# Patient Record
Sex: Female | Born: 1995
Health system: Southern US, Community
[De-identification: ages and names within clinical notes are randomized; demographics above are authoritative.]

## PROBLEM LIST (undated history)

## (undated) ENCOUNTER — Inpatient Hospital Stay: Admission: EM | Payer: Self-pay | Source: Home / Self Care

## (undated) DIAGNOSIS — R519 Headache, unspecified: Secondary | ICD-10-CM

## (undated) DIAGNOSIS — F909 Attention-deficit hyperactivity disorder, unspecified type: Secondary | ICD-10-CM

## (undated) DIAGNOSIS — M419 Scoliosis, unspecified: Secondary | ICD-10-CM

## (undated) DIAGNOSIS — R51 Headache: Secondary | ICD-10-CM

## (undated) HISTORY — DX: Headache, unspecified: R51.9

## (undated) HISTORY — DX: Headache: R51

## (undated) HISTORY — DX: Attention-deficit hyperactivity disorder, unspecified type: F90.9

## (undated) HISTORY — DX: Scoliosis, unspecified: M41.9

---
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
## 2017-04-17 ENCOUNTER — Ambulatory Visit: Payer: Medicaid Other | Admitting: Neurology

## 2017-05-10 ENCOUNTER — Encounter (HOSPITAL_COMMUNITY): Payer: Self-pay | Admitting: Emergency Medicine

## 2017-05-10 ENCOUNTER — Emergency Department (HOSPITAL_COMMUNITY): Payer: Medicaid Other

## 2017-05-10 DIAGNOSIS — R079 Chest pain, unspecified: Secondary | ICD-10-CM | POA: Diagnosis present

## 2017-05-10 DIAGNOSIS — R0789 Other chest pain: Secondary | ICD-10-CM | POA: Insufficient documentation

## 2017-05-10 LAB — CBC
HEMATOCRIT: 40.7 % (ref 36.0–46.0)
Hemoglobin: 13.4 g/dL (ref 12.0–15.0)
MCH: 28.6 pg (ref 26.0–34.0)
MCHC: 32.9 g/dL (ref 30.0–36.0)
MCV: 87 fL (ref 78.0–100.0)
Platelets: 296 10*3/uL (ref 150–400)
RBC: 4.68 MIL/uL (ref 3.87–5.11)
RDW: 12.7 % (ref 11.5–15.5)
WBC: 10.4 10*3/uL (ref 4.0–10.5)

## 2017-05-10 LAB — BASIC METABOLIC PANEL
Anion gap: 9 (ref 5–15)
BUN: 8 mg/dL (ref 6–20)
CHLORIDE: 103 mmol/L (ref 101–111)
CO2: 23 mmol/L (ref 22–32)
Calcium: 9.2 mg/dL (ref 8.9–10.3)
Creatinine, Ser: 0.62 mg/dL (ref 0.44–1.00)
GFR calc Af Amer: >60 mL/min (ref >60)
GFR calc non Af Amer: >60 mL/min (ref >60)
GLUCOSE: 88 mg/dL (ref 65–99)
POTASSIUM: 3.8 mmol/L (ref 3.5–5.1)
Sodium: 135 mmol/L (ref 135–145)

## 2017-05-10 LAB — I-STAT TROPONIN, ED: Troponin i, poc: 0 ng/mL (ref 0.00–0.08)

## 2017-05-10 NOTE — ED Triage Notes (Signed)
Pt presents to ED for central chest pain starting 2 hours ago.  Pt denies any other associated symptoms.  Denies recent cough or vomiting.  Temp of 100.1 at triage.

## 2017-05-11 ENCOUNTER — Emergency Department (HOSPITAL_COMMUNITY)
Admission: EM | Admit: 2017-05-11 | Discharge: 2017-05-11 | Disposition: A | Discharge status: Home / Self Care | Payer: Medicaid Other | Attending: Emergency Medicine | Admitting: Emergency Medicine

## 2017-05-11 DIAGNOSIS — R0789 Other chest pain: Secondary | ICD-10-CM

## 2017-05-11 MED ORDER — IBUPROFEN 800 MG PO TABS: 800.0000 mg | ORAL_TABLET | Freq: Three times a day (TID) | ORAL | 0 refills | Status: DC | PRN

## 2017-05-11 MED ORDER — ACETAMINOPHEN 500 MG PO TABS
1000.0000 mg | ORAL_TABLET | Freq: Once | ORAL | Status: AC
  Administered 2017-05-11: 1000 mg via ORAL
  Filled 2017-05-11: qty 2

## 2017-05-11 MED ORDER — IBUPROFEN 800 MG PO TABS
800.0000 mg | ORAL_TABLET | Freq: Once | ORAL | Status: AC
  Administered 2017-05-11: 800 mg via ORAL
  Filled 2017-05-11: qty 1

## 2017-05-11 NOTE — Discharge Instructions (Signed)
You may alternate Tylenol 1000 mg every 6 hours as needed for pain and Ibuprofen 800 mg every 8 hours as needed for pain.  Please take Ibuprofen with food. ° °

## 2017-05-11 NOTE — ED Provider Notes (Signed)
By signing my name below, I, Rosana Fret, attest that this documentation has been prepared under the direction and in the presence of Kaylamarie Swickard, Layla Maw, DO. Electronically Signed: Rosana Fret, ED Scribe. 05/11/17. 1:46 AM.  TIME SEEN: 1:40 AM  CHIEF COMPLAINT: CP  HPI: HPI Comments: Kathy Kane is a 21 y.o. female who presents to the Emergency Department complaining of sharp, central CP onset 3 hours ago. No modifying factors. Pt states she was seen in the ED previously for similar pain and at that time she was diagnosed with costochondritis. No treatments prior to arrival. No recent tick bite or recent camping, hiking. Pt takes birth control medication.  No hx of PE/DVT, recent long travel, surgery, fracture, or prolonged immobilization. No tobacco use. Pt denies cough, vomiting, diarrhea, sore throat, ear pain or any other complaints at this time. She was found to have a low-grade fever here in the emergency department. She states she was not aware of this at home.   ROS: See HPI Constitutional: no fever  Eyes: no drainage  ENT: no runny nose, no sore throat, no ear pain Cardiovascular:  + chest pain  Resp: no SOB, no cough GI: no vomiting, no diarrhea GU: no dysuria Integumentary: no rash  Allergy: no hives  Musculoskeletal: no leg swelling  Neurological: no slurred speech ROS otherwise negative  PAST MEDICAL HISTORY/PAST SURGICAL HISTORY:  Past Medical History:  Diagnosis Date  . Migraine     MEDICATIONS:  Prior to Admission medications   Medication Sig Start Date End Date Taking? Authorizing Provider  ASHLYNA 0.15-0.03 &0.01 MG tablet Take 1 tablet by mouth daily. 08/04/16   [provider]  methylphenidate 36 MG PO CR tablet Take 72 mg by mouth daily. 09/16/16   [provider]    ALLERGIES:  No Known Allergies  SOCIAL HISTORY:  Social History  Substance Use Topics  . Smoking status: Never Smoker  . Smokeless tobacco: Never Used  .  Alcohol use No    FAMILY HISTORY: Family History  Problem Relation Age of Onset  . Migraines Mother   . Multiple sclerosis Mother     EXAM: BP (!) 117/55 (BP Location: Left Arm)   Pulse (!) 120   Temp 100.1 F (37.8 C) (Oral)   Resp 16   LMP 05/01/2017   SpO2 100%  CONSTITUTIONAL: Alert and oriented and responds appropriately to questions. Well-appearing; well-nourished, smiling, nontoxic, well-hydrated HEAD: Normocephalic EYES: Conjunctivae clear, pupils appear equal, EOMI ENT: normal nose; moist mucous membranes NECK: Supple, no meningismus, no nuchal rigidity, no LAD  CARD: RRR; S1 and S2 appreciated; no murmurs, no clicks, no rubs, no gallops CHEST:  Chest wall is tender to palpation over the anterior chest wall which reproduces her pain.  No crepitus, ecchymosis, erythema, warmth, rash or other lesions present.   RESP: Normal chest excursion without splinting or tachypnea; breath sounds clear and equal bilaterally; no wheezes, no rhonchi, no rales, no hypoxia or respiratory distress, speaking full sentences ABD/GI: Normal bowel sounds; non-distended; soft, non-tender, no rebound, no guarding, no peritoneal signs, no hepatosplenomegaly BACK:  The back appears normal and is non-tender to palpation, there is no CVA tenderness EXT: Normal ROM in all joints; non-tender to palpation; no edema; normal capillary refill; no cyanosis, no calf tenderness or swelling    SKIN: Normal color for age and race; warm; no rash NEURO: Moves all extremities equally PSYCH: The patient's mood and manner are appropriate. Grooming and personal hygiene are appropriate.  MEDICAL  DECISION MAKING: Patient here with atypical chest pain.  I suspect that this is chest wall pain. Cardiac labs obtained in triage are unremarkable. I do not think this is a pulmonary embolus. She is not tachycardic now that her fever has resolved. She has no tachypnea or hypoxia.  I do not think this is a dissection. She has no  pneumonia on her chest x-ray. No other signs of infection at this time. No meningismus, signs of pharyngitis, abdominal exam is benign. No recent tick bite. Doubt myocarditis, pericarditis. I recommended alternating Tylenol and Motrin for pain. I feel she is safe to be discharged. She is comfortable with this plan.  At this time, I do not feel there is any life-threatening condition present. I have reviewed and discussed all results (EKG, imaging, lab, urine as appropriate) and exam findings with patient/family. I have reviewed nursing notes and appropriate previous records.  I feel the patient is safe to be discharged home without further emergent workup and can continue workup as an outpatient as needed. Discussed usual and customary return precautions. Patient/family verbalize understanding and are comfortable with this plan.  Outpatient follow-up has been provided if needed. All questions have been answered.    EKG Interpretation  Date/Time:  Thursday May 10 2017 23:12:17 EDT Ventricular Rate:  110 PR Interval:  122 QRS Duration: 68 QT Interval:  314 QTC Calculation: 424 R Axis:   93 Text Interpretation:  Sinus tachycardia Right atrial enlargement Rightward axis Pulmonary disease pattern Abnormal ECG No significant change since last tracing Confirmed by Rochele RaringWard, Debralee Braaksma [phone] on 05/11/2017 12:32:16 AM Also confirmed by Bellagrace Sylvan, Baxter HireKristen [phone]), editor Madalyn RobEverhart, Marilyn [phone]  on 05/11/2017 7:51:07 AM        I personally performed the services described in this documentation, which was scribed in my presence. The recorded information has been reviewed and is accurate.    Ervey Fallin, Layla MawKristen N, DO 05/11/17 [id number]

## 2017-06-08 ENCOUNTER — Ambulatory Visit: Payer: Medicaid Other | Admitting: Internal Medicine

## 2017-06-18 ENCOUNTER — Encounter: Payer: Self-pay | Admitting: Cardiology

## 2017-06-18 DIAGNOSIS — M545 Low back pain, unspecified: Secondary | ICD-10-CM | POA: Insufficient documentation

## 2017-06-18 DIAGNOSIS — R079 Chest pain, unspecified: Secondary | ICD-10-CM

## 2017-06-18 NOTE — Progress Notes (Signed)
Cardiology Office Note  Date: 06/19/2017   ID: Kathy Kane, DOB Aug 30, 1996, MRN [id number]  PCP: Pearson GrippeKim, James, MD  Consulting Cardiologist: Nona DellSamuel Hykeem Ojeda, MD   Chief Complaint  Patient presents with  . Chest Pain    History of Present Illness: Kathy Kane is a 21 y.o. female referred for cardiology consultation by Dr. Selena BattenKim for evaluation of chest pain. She is here with her mother today. She states that over the last few years she has been having intermittent episodes of sharp chest discomfort, no obvious precipitant, feels short of breath has to sit still when this happens. She does not endorse any definite sense of palpitations at these times, but has had elevated heart rate related to medications for ADHD. She does not report any syncope. With exertion she does not have any reproducible chest pain or shortness of breath.  Record review finds ER visit in June secondary to atypical chest pain. ECG, chest x-ray, lab work did not reveal any obvious acute abnormalities. Troponin I level was normal. She was noted to have sinus tachycardia initially and also a low-grade elevation in temperature at 100.1 degrees.  She does have scoliosis, does not report any particular movement that will bring on chest pain.  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  Past Medical History:  Diagnosis Date  . ADHD   . Headache   . Scoliosis     Past Surgical History:  Procedure Laterality Date  . MOUTH SURGERY  2017    Current Outpatient Prescriptions  Medication Sig Dispense Refill  . ASHLYNA 0.15-0.03 &0.01 MG tablet Take 1 tablet by mouth daily.  6  . ibuprofen (ADVIL,MOTRIN) 800 MG tablet Take 1 tablet (800 mg total) by mouth every 8 (eight) hours as needed for mild pain. 30 tablet 0  . methocarbamol (ROBAXIN) 500 MG tablet Take 500 mg by mouth 2 (two) times daily.    . methylphenidate 36 MG PO CR tablet Take 72 mg by mouth 2 (two) times  daily.   0   No current facility-administered medications for this visit.    Allergies:  Adderall xr [amphetamine-dextroamphet er]   Social History: The patient  reports that she has never smoked. She has never used smokeless tobacco. She reports that she does not drink alcohol or use drugs.   Family History: The patient's family history includes Migraines in her mother; Multiple sclerosis in her mother.   ROS:  Please see the history of present illness. Otherwise, complete review of systems is positive for none.  All other systems are reviewed and negative.   Physical Exam: VS:  BP (!) 146/86 (BP Location: Right Arm)   Pulse (!) 108   Ht 5' 4.5" (1.638 m)   Wt 132 lb (59.9 kg)   SpO2 98%   BMI 22.31 kg/m , BMI Body mass index is 22.31 kg/m.  Wt Readings from Last 3 Encounters:  06/19/17 132 lb (59.9 kg) (58 %, Z= 0.20)*  10/16/16 133 lb 9.6 oz (60.6 kg) (64 %, Z= 0.35)*  02/09/15 121 lb 12.8 oz (55.2 kg) (50 %, Z= -0.01)*   * Growth percentiles are based on CDC 2-20 Years data.    General: Normally nourished appearing young woman, no distress. HEENT: Conjunctiva and lids normal, oropharynx clear. Neck: Supple, no elevated JVP or carotid bruits, no thyromegaly. Lungs: Clear to auscultation, nonlabored breathing at rest. Cardiac: Regular rate and rhythm, no S3 or significant systolic  murmur, no pericardial rub. Abdomen: Soft, nontender, bowel sounds present. Extremities: No pitting edema, distal pulses 2+. Skin: Warm and dry. Musculoskeletal: No kyphosis. Neuropsychiatric: Alert and oriented x3, affect grossly appropriate.  ECG: I personally reviewed the tracing from 05/10/2017 which showed sinus tachycardia.  Recent Labwork: 05/10/2017: BUN 8; Creatinine, Ser 0.62; Hemoglobin 13.4; Platelets 296; Potassium 3.8; Sodium 135  October 2017: Hemoglobin 13.5, platelets 270, BUN 14, creatinine 0.71, potassium 4.6, AST 18, ALT 29, TSH 2.18  Other Studies Reviewed Today:  Chest  x-ray 05/10/2017: FINDINGS: The lungs are well-aerated and clear. There is no evidence of focal opacification, pleural effusion or pneumothorax.  The heart is normal in size; the mediastinal contour is within normal limits. No acute osseous abnormalities are seen.  IMPRESSION: No acute cardiopulmonary process seen.  Assessment and Plan:  1. History of intermittent atypical chest pain as outlined above. No associated palpitations at the time of symptoms, no history of syncope. No clear exertional component. I reviewed her recent lab work, ECG, and chest x-ray. Symptoms are very unlikely to be related to ischemic heart disease, particularly at age 21. Stress testing is not indicated at this time. We did discuss obtaining an echocardiogram to ensure that she has a structurally normal heart, exclude mitral valve prolapse which would increase her chances of having intermittent chest pain or even arrhythmias. If echocardiogram is reassuring, no further cardiac testing is anticipated.  2. Scoliosis. Patient states that [facility] is [facility].  3. ADHD, currently on methylphenidate.  Current medicines were reviewed with the patient today.   Orders Placed This Encounter  Procedures  . EKG 12-Lead  . ECHOCARDIOGRAM COMPLETE    Disposition: Call with test results.  Signed, Jonelle SidleSamuel G. Carisma Troupe, MD, [facility] 06/19/2017 9:43 AM    [facility] [address], [place], [place] [address] Phone: [phone]; Fax: [phone]

## 2017-06-19 ENCOUNTER — Encounter: Payer: Self-pay | Admitting: Cardiology

## 2017-06-19 ENCOUNTER — Ambulatory Visit (INDEPENDENT_AMBULATORY_CARE_PROVIDER_SITE_OTHER): Payer: Medicaid Other | Admitting: Cardiology

## 2017-06-19 VITALS — BP 146/86 | HR 108 | Ht 64.5 in | Wt 132.0 lb

## 2017-06-19 DIAGNOSIS — M419 Scoliosis, unspecified: Secondary | ICD-10-CM

## 2017-06-19 DIAGNOSIS — F909 Attention-deficit hyperactivity disorder, unspecified type: Secondary | ICD-10-CM | POA: Diagnosis not present

## 2017-06-19 DIAGNOSIS — R002 Palpitations: Secondary | ICD-10-CM

## 2017-06-19 DIAGNOSIS — R0789 Other chest pain: Secondary | ICD-10-CM

## 2017-06-19 NOTE — Patient Instructions (Signed)
Your physician recommends that you schedule a follow-up appointment in: to be determined,we will call you with results.    Your physician has requested that you have an echocardiogram. Echocardiography is a painless test that uses sound waves to create images of your heart. It provides your doctor with information about the size and shape of your heart and how well your heart's chambers and valves are working. This procedure takes approximately one hour. There are no restrictions for this procedure.    No labs this visit      Thank you for choosing [facility] !

## 2017-06-21 ENCOUNTER — Ambulatory Visit (HOSPITAL_COMMUNITY)
Admission: RE | Admit: 2017-06-21 | Discharge: 2017-06-21 | Disposition: A | Discharge status: Home / Self Care | Payer: Medicaid Other | Source: Ambulatory Visit | Attending: Cardiology | Admitting: Cardiology

## 2017-06-21 DIAGNOSIS — M419 Scoliosis, unspecified: Secondary | ICD-10-CM | POA: Diagnosis not present

## 2017-06-21 DIAGNOSIS — R002 Palpitations: Secondary | ICD-10-CM | POA: Diagnosis not present

## 2017-06-21 DIAGNOSIS — R0789 Other chest pain: Secondary | ICD-10-CM

## 2017-06-21 DIAGNOSIS — I340 Nonrheumatic mitral (valve) insufficiency: Secondary | ICD-10-CM | POA: Insufficient documentation

## 2017-06-21 DIAGNOSIS — F909 Attention-deficit hyperactivity disorder, unspecified type: Secondary | ICD-10-CM | POA: Insufficient documentation

## 2017-06-21 NOTE — Progress Notes (Signed)
*  PRELIMINARY RESULTS* Echocardiogram 2D Echocardiogram has been performed.  Kathy Kane, Kathy Kane 06/21/2017, 9:10 AM

## 2017-11-28 ENCOUNTER — Encounter (HOSPITAL_COMMUNITY): Payer: Self-pay | Admitting: *Deleted

## 2017-11-28 ENCOUNTER — Emergency Department (HOSPITAL_COMMUNITY)
Admission: EM | Admit: 2017-11-28 | Discharge: 2017-11-28 | Disposition: A | Discharge status: Home / Self Care | Payer: Medicaid Other | Attending: Emergency Medicine | Admitting: Emergency Medicine

## 2017-11-28 DIAGNOSIS — J3489 Other specified disorders of nose and nasal sinuses: Secondary | ICD-10-CM | POA: Insufficient documentation

## 2017-11-28 DIAGNOSIS — F902 Attention-deficit hyperactivity disorder, combined type: Secondary | ICD-10-CM | POA: Insufficient documentation

## 2017-11-28 DIAGNOSIS — H6982 Other specified disorders of Eustachian tube, left ear: Secondary | ICD-10-CM | POA: Insufficient documentation

## 2017-11-28 DIAGNOSIS — R0981 Nasal congestion: Secondary | ICD-10-CM | POA: Diagnosis not present

## 2017-11-28 DIAGNOSIS — Z79899 Other long term (current) drug therapy: Secondary | ICD-10-CM | POA: Diagnosis not present

## 2017-11-28 DIAGNOSIS — H9202 Otalgia, left ear: Secondary | ICD-10-CM | POA: Diagnosis present

## 2017-11-28 MED ORDER — PSEUDOEPHEDRINE HCL 60 MG PO TABS: 60.0000 mg | ORAL_TABLET | Freq: Four times a day (QID) | ORAL | 0 refills | Status: DC | PRN

## 2017-11-28 MED ORDER — FLUTICASONE PROPIONATE 50 MCG/ACT NA SUSP: 1.0000 | Freq: Every day | NASAL | 0 refills | Status: DC

## 2017-11-28 NOTE — ED Triage Notes (Signed)
Pt reports she has had fluid in her left ear. She says that she has had discomfort in her ear since Thanksgiving. Pt denies fevers and has not had any medications PTA.

## 2017-11-29 NOTE — ED Provider Notes (Signed)
[facility] EMERGENCY DEPARTMENT Provider Note   CSN: [id number] Arrival date & time: 11/28/17  2223     History   Chief Complaint Chief Complaint  Patient presents with  . Otalgia    HPI Kathy Kane is a 22 y.o. female.  The history is provided by the patient.  Otalgia  This is a chronic problem. Episode onset: approximately 6 weeks ago, first noticed when driving at high elevations coming home from [place] Thanksgiving weekend. There is pain in the left ear. The problem occurs constantly. The problem has not changed since onset.There has been no fever. Associated symptoms include hearing loss. Pertinent negatives include no ear discharge, no rhinorrhea, no sore throat, no abdominal pain and no cough. Associated symptoms comments: Decreased hearing acuity in left ear.  She describes the need to "pop" that ear which she feels would resolve the problem but she is unable to make this happen with chewing gum and yawning.. Her past medical history does not include chronic ear infection.    Past Medical History:  Diagnosis Date  . ADHD   . Headache   . Scoliosis     Patient Active Problem List   Diagnosis Date Noted  . Chest pain, unspecified 06/18/2017  . Low back pain 06/18/2017  . Attention deficit hyperactivity disorder (ADHD) 10/17/2016    Past Surgical History:  Procedure Laterality Date  . MOUTH SURGERY  2017    OB History    No data available       Home Medications    Prior to Admission medications   Medication Sig Start Date End Date Taking? Authorizing Provider  ASHLYNA 0.15-0.03 &0.01 MG tablet Take 1 tablet by mouth daily. 08/04/16   [provider]  fluticasone (FLONASE) 50 MCG/ACT nasal spray Place 1 spray into both nostrils daily. 11/28/17   Kyria Bumgardner, Raynelle Fanning, PA-C  ibuprofen (ADVIL,MOTRIN) 800 MG tablet Take 1 tablet (800 mg total) by mouth every 8 (eight) hours as needed for mild pain. 05/11/17   Ward, Layla Maw, DO  methocarbamol (ROBAXIN)  500 MG tablet Take 500 mg by mouth 2 (two) times daily.    [provider]  methylphenidate 36 MG PO CR tablet Take 72 mg by mouth 2 (two) times daily.  09/16/16   [provider]  pseudoephedrine (SUDAFED) 60 MG tablet Take 1 tablet (60 mg total) by mouth every 6 (six) hours as needed for congestion. 11/28/17   Burgess Amor, PA-C    Family History Family History  Problem Relation Age of Onset  . Migraines Mother   . Multiple sclerosis Mother     Social History Social History   Tobacco Use  . Smoking status: Never Smoker  . Smokeless tobacco: Never Used  Substance Use Topics  . Alcohol use: No  . Drug use: No     Allergies   Adderall xr [amphetamine-dextroamphet er]   Review of Systems Review of Systems  Constitutional: Negative for chills and fever.  HENT: Positive for congestion, ear pain and hearing loss. Negative for ear discharge, rhinorrhea, sinus pressure, sore throat, trouble swallowing and voice change.   Eyes: Negative for discharge.  Respiratory: Negative for cough, shortness of breath, wheezing and stridor.   Cardiovascular: Negative for chest pain.  Gastrointestinal: Negative for abdominal pain.  Genitourinary: Negative.      Physical Exam Updated Vital Signs BP 117/64 (BP Location: Right Arm)   Pulse 84   Temp 98.3 F (36.8 C) (Oral)   Resp 17  Ht 5\' 4"  (1.626 m)   Wt 58.1 kg (128 lb)   LMP 10/30/2017   SpO2 100%   BMI 21.97 kg/m   Physical Exam  Constitutional: She is oriented to person, place, and time. She appears well-developed and well-nourished.  HENT:  Head: Normocephalic and atraumatic.  Right Ear: Tympanic membrane and ear canal normal.  Left Ear: Ear canal normal. No mastoid tenderness. Tympanic membrane is retracted. A middle ear effusion is present.  Nose: Mucosal edema and rhinorrhea present.  Mouth/Throat: Uvula is midline, oropharynx is clear and moist and mucous membranes are normal. No oropharyngeal exudate,  posterior oropharyngeal edema, posterior oropharyngeal erythema or tonsillar abscesses.  Eyes: Conjunctivae are normal.  Cardiovascular: Normal rate and normal heart sounds.  Pulmonary/Chest: Effort normal. No respiratory distress. She has no wheezes. She has no rales.  Musculoskeletal: Normal range of motion.  Neurological: She is alert and oriented to person, place, and time.  Skin: Skin is warm and dry. No rash noted.  Psychiatric: She has a normal mood and affect.     ED Treatments / Results  Labs (all labs ordered are listed, but only abnormal results are displayed) Labs Reviewed - No data to display  EKG  EKG Interpretation None       Radiology No results found.  Procedures Procedures (including critical care time)  Medications Ordered in ED Medications - No data to display   Initial Impression / Assessment and Plan / ED Course  I have reviewed the triage vital signs and the nursing notes.  Pertinent labs & imaging results that were available during my care of the patient were reviewed by me and considered in my medical decision making (see chart for details).     The patient appears reasonably screened and/or stabilized for discharge and I doubt any other medical condition or other [facility] requiring further screening, evaluation, or treatment in the ED at this time prior to discharge.  Pt with sx and exam suggesting eustachian dysfunction, no sign of otitis media or externa. No mastoid tenderness.  Flonase, sudafed, referral to ent for further eval if sx persist despite tx.  Final Clinical Impressions(s) / ED Diagnoses   Final diagnoses:  Eustachian tube dysfunction, left    ED Discharge Orders        Ordered    pseudoephedrine (SUDAFED) 60 MG tablet  Every 6 hours PRN     11/28/17 2310    fluticasone (FLONASE) 50 MCG/ACT nasal spray  Daily     11/28/17 2310       Burgess Amordol, Aaden Buckman, PA-C 11/29/17 [phone]    Bethann BerkshireZammit, Joseph, MD 11/29/17 1555

## 2017-12-03 IMAGING — CR DG CHEST 2V
2 series · 2 of 2 positions shown · non-contrast
Comparison: Chest radiograph performed 02/09/2015

CLINICAL DATA: Acute onset of generalized chest pain. Initial
encounter.

EXAM:
CHEST  2 VIEW

[chest pa]
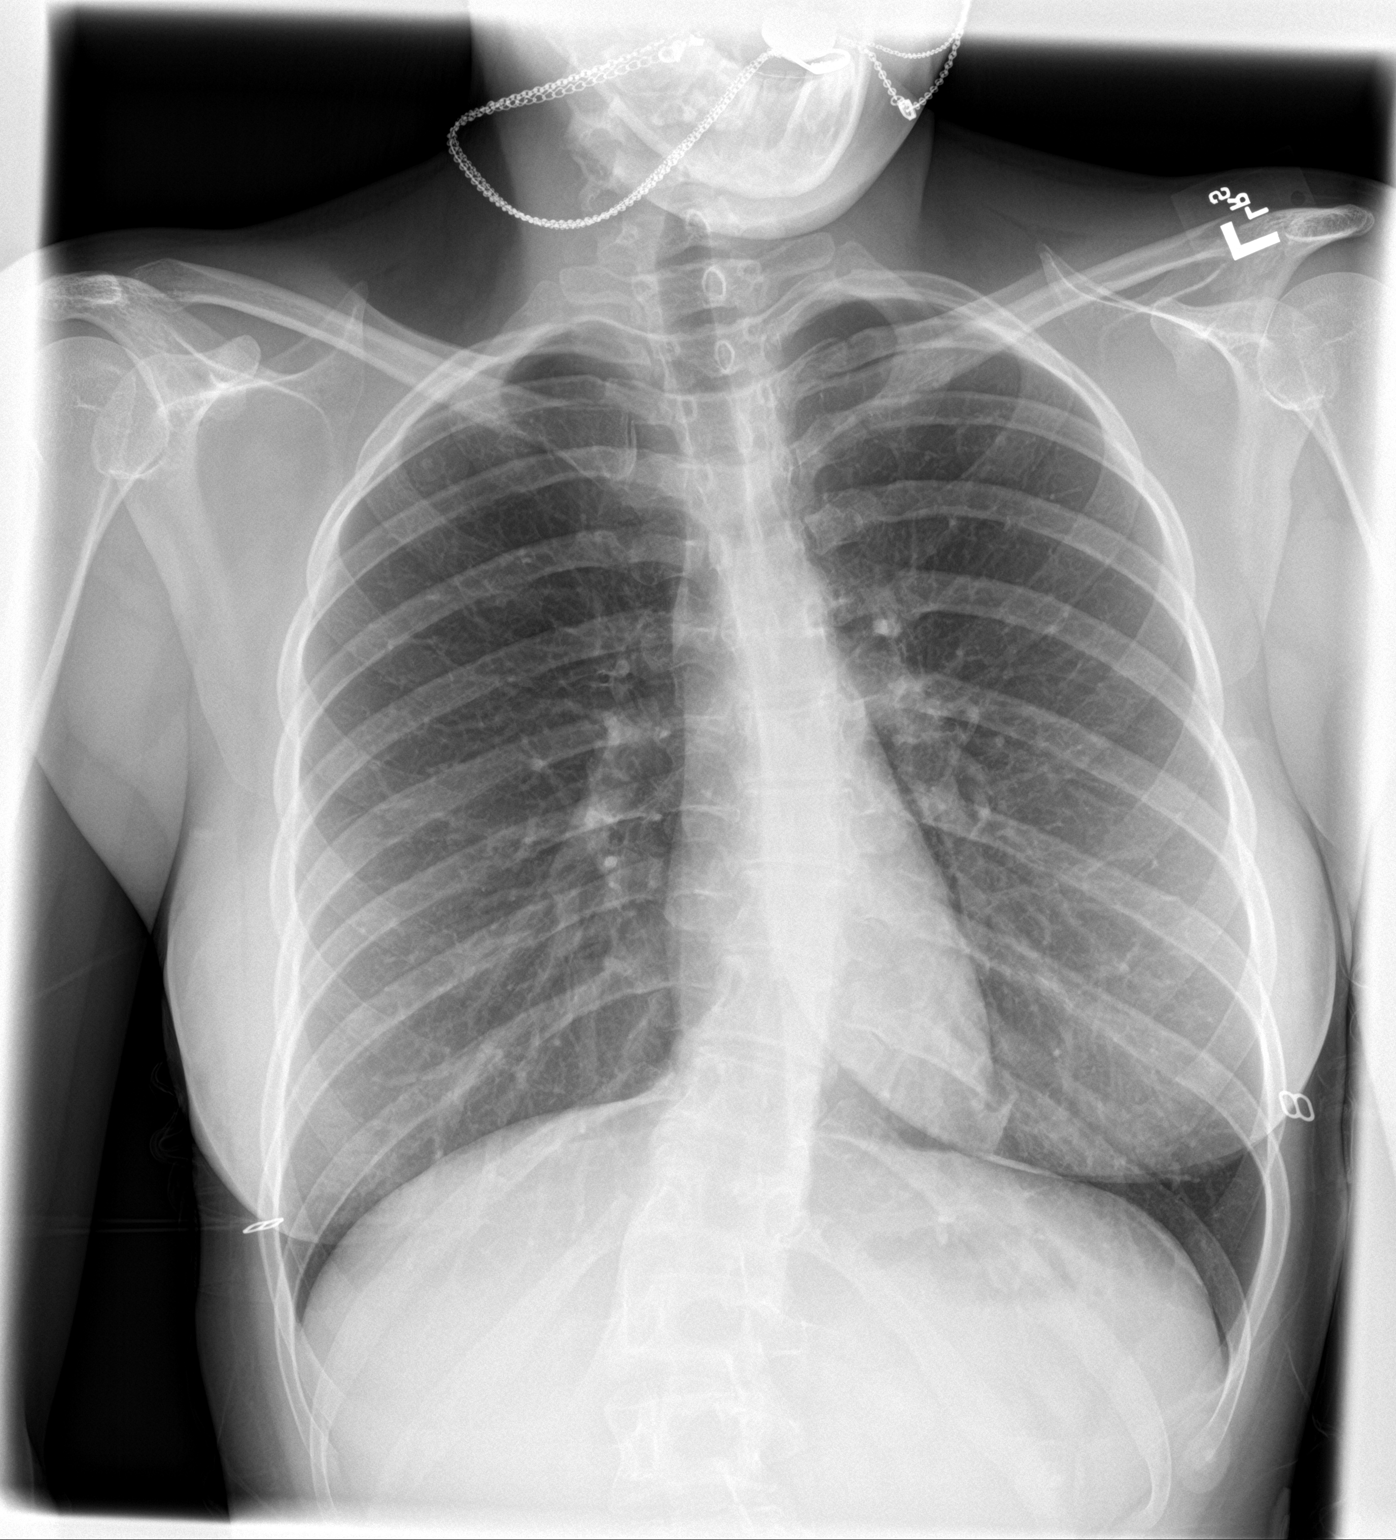

[chest lat]
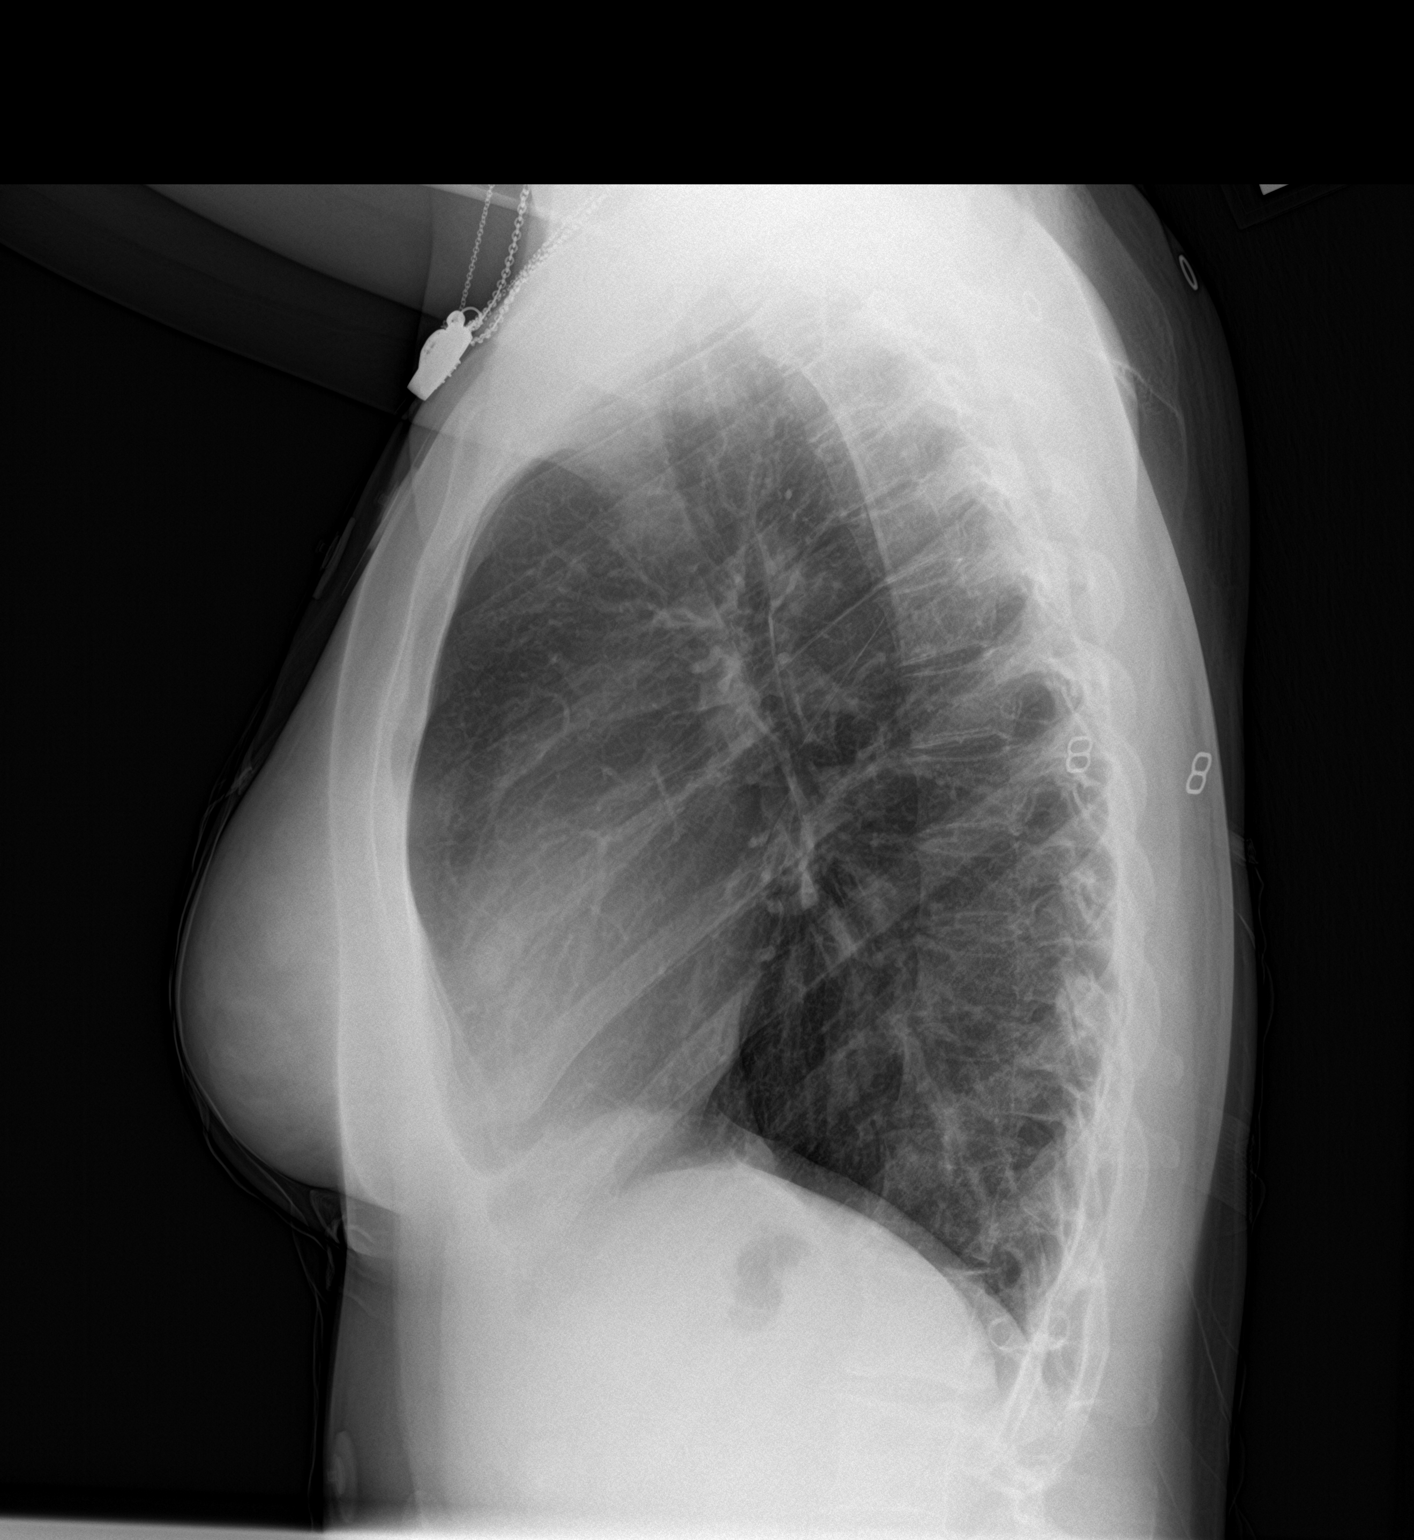

[2 of 2 positions shown; findings below may reference images not displayed]

FINDINGS: The lungs are well-aerated and clear. There is no evidence of focal
opacification, pleural effusion or pneumothorax.

The heart is normal in size; the mediastinal contour is within
normal limits. No acute osseous abnormalities are seen.
IMPRESSION: No acute cardiopulmonary process seen.

## 2018-01-22 DIAGNOSIS — F900 Attention-deficit hyperactivity disorder, predominantly inattentive type: Secondary | ICD-10-CM | POA: Diagnosis not present

## 2018-01-22 DIAGNOSIS — M419 Scoliosis, unspecified: Secondary | ICD-10-CM | POA: Diagnosis not present

## 2018-01-22 DIAGNOSIS — Z79899 Other long term (current) drug therapy: Secondary | ICD-10-CM | POA: Diagnosis not present

## 2018-03-07 ENCOUNTER — Encounter (HOSPITAL_BASED_OUTPATIENT_CLINIC_OR_DEPARTMENT_OTHER): Payer: Self-pay | Admitting: Emergency Medicine

## 2018-03-07 ENCOUNTER — Emergency Department (HOSPITAL_BASED_OUTPATIENT_CLINIC_OR_DEPARTMENT_OTHER)
Admission: EM | Admit: 2018-03-07 | Discharge: 2018-03-07 | Disposition: A | Discharge status: Home / Self Care | Payer: 59 | Attending: Emergency Medicine | Admitting: Emergency Medicine

## 2018-03-07 ENCOUNTER — Other Ambulatory Visit: Payer: Self-pay

## 2018-03-07 DIAGNOSIS — Z79899 Other long term (current) drug therapy: Secondary | ICD-10-CM | POA: Insufficient documentation

## 2018-03-07 DIAGNOSIS — B354 Tinea corporis: Secondary | ICD-10-CM | POA: Diagnosis not present

## 2018-03-07 DIAGNOSIS — R21 Rash and other nonspecific skin eruption: Secondary | ICD-10-CM | POA: Diagnosis present

## 2018-03-07 MED ORDER — CLOTRIMAZOLE-BETAMETHASONE 1-0.05 % EX CREA: TOPICAL_CREAM | CUTANEOUS | 1 refills | Status: DC

## 2018-03-07 NOTE — ED Triage Notes (Signed)
Has had a circular area on left lower leg x 3-4 months and thought she should get it checked out. About size of 50 cent piece , skin discolored

## 2018-03-07 NOTE — ED Provider Notes (Signed)
[facility] EMERGENCY DEPARTMENT Provider Note   CSN: [id number] Arrival date & time: 03/07/18  0056     History   Chief Complaint Chief Complaint  Patient presents with  . Abscess    HPI Kathy Kane is a 22 y.o. female.  Patient is a 22 year old female presenting with complaints of rash to the left lower leg.  This rash has been present for the past 3-4 months and began in the absence of any injury or trauma.  She denies significant itching.  She denies any pain or drainage.  The history is provided by the patient.    Past Medical History:  Diagnosis Date  . ADHD   . Headache   . Scoliosis     Patient Active Problem List   Diagnosis Date Noted  . Chest pain, unspecified 06/18/2017  . Low back pain 06/18/2017  . Attention deficit hyperactivity disorder (ADHD) 10/17/2016    Past Surgical History:  Procedure Laterality Date  . MOUTH SURGERY  2017     OB History   None      Home Medications    Prior to Admission medications   Medication Sig Start Date End Date Taking? Authorizing Provider  methylphenidate 36 MG PO CR tablet Take 72 mg by mouth 2 (two) times daily.  09/16/16  Yes [provider]  ASHLYNA 0.15-0.03 &0.01 MG tablet Take 1 tablet by mouth daily. 08/04/16   [provider]  fluticasone (FLONASE) 50 MCG/ACT nasal spray Place 1 spray into both nostrils daily. 11/28/17   Idol, Raynelle FanningJulie, PA-C  ibuprofen (ADVIL,MOTRIN) 800 MG tablet Take 1 tablet (800 mg total) by mouth every 8 (eight) hours as needed for mild pain. 05/11/17   Ward, Kathy MawKristen N, DO  methocarbamol (ROBAXIN) 500 MG tablet Take 500 mg by mouth 2 (two) times daily.    [provider]  pseudoephedrine (SUDAFED) 60 MG tablet Take 1 tablet (60 mg total) by mouth every 6 (six) hours as needed for congestion. 11/28/17   Burgess AmorIdol, Julie, PA-C    Family History Family History  Problem Relation Age of Onset  . Migraines Mother   . Multiple sclerosis Mother      Social History Social History   Tobacco Use  . Smoking status: Never Smoker  . Smokeless tobacco: Never Used  Substance Use Topics  . Alcohol use: No  . Drug use: No     Allergies   Adderall xr [amphetamine-dextroamphet er]   Review of Systems Review of Systems  All other systems reviewed and are negative.    Physical Exam Updated Vital Signs BP 134/79 (BP Location: Right Arm)   Pulse 90   Temp 98.2 F (36.8 C) (Oral)   Resp 16   Ht 5\' 4"  (1.626 m)   Wt 59.9 kg (132 lb)   SpO2 98%   BMI 22.66 kg/m   Physical Exam  Constitutional: She is oriented to person, place, and time. She appears well-developed and well-nourished. No distress.  HENT:  Head: Normocephalic and atraumatic.  Neck: Normal range of motion. Neck supple.  Neurological: She is alert and oriented to person, place, and time.  Skin: Skin is warm and dry. She is not diaphoretic.  There is a 2 cm, round area to the anterior aspect of the mid left lower leg.    Nursing note and vitals reviewed.    ED Treatments / Results  Labs (all labs ordered are listed, but only abnormal results are displayed) Labs Reviewed -  No data to display  EKG None  Radiology No results found.  Procedures Procedures (including critical care time)  Medications Ordered in ED Medications - No data to display   Initial Impression / Assessment and Plan / ED Course  I have reviewed the triage vital signs and the nursing notes.  Pertinent labs & imaging results that were available during my care of the patient were reviewed by me and considered in my medical decision making (see chart for details).  Appears to be tinea corporis.  Final Clinical Impressions(s) / ED Diagnoses   Final diagnoses:  None    ED Discharge Orders    None       Geoffery Lyons, MD 03/07/18 [phone]

## 2018-03-07 NOTE — Discharge Instructions (Addendum)
Lotrisone cream twice daily as prescribed.  Follow-up with your primary doctor if symptoms are not improving in the next 1-2 weeks.

## 2018-03-14 DIAGNOSIS — F909 Attention-deficit hyperactivity disorder, unspecified type: Secondary | ICD-10-CM | POA: Diagnosis not present

## 2018-03-14 DIAGNOSIS — Z79899 Other long term (current) drug therapy: Secondary | ICD-10-CM | POA: Diagnosis not present

## 2018-03-14 DIAGNOSIS — L02416 Cutaneous abscess of left lower limb: Secondary | ICD-10-CM | POA: Diagnosis not present

## 2018-05-01 DIAGNOSIS — M4125 Other idiopathic scoliosis, thoracolumbar region: Secondary | ICD-10-CM | POA: Diagnosis not present

## 2018-05-01 DIAGNOSIS — M545 Low back pain: Secondary | ICD-10-CM | POA: Diagnosis not present

## 2018-05-01 DIAGNOSIS — M546 Pain in thoracic spine: Secondary | ICD-10-CM | POA: Diagnosis not present

## 2018-06-11 DIAGNOSIS — M419 Scoliosis, unspecified: Secondary | ICD-10-CM | POA: Diagnosis not present

## 2018-06-11 DIAGNOSIS — F900 Attention-deficit hyperactivity disorder, predominantly inattentive type: Secondary | ICD-10-CM | POA: Diagnosis not present

## 2018-06-11 DIAGNOSIS — Z79899 Other long term (current) drug therapy: Secondary | ICD-10-CM | POA: Diagnosis not present

## 2018-07-31 MED FILL — CONCERTA 36 MG TABLET ER: 36 | 30 days supply | Qty: 60 | Fill #0

## 2018-08-30 MED FILL — CONCERTA 36 MG TABLET ER: 36 | 30 days supply | Qty: 60 | Fill #0

## 2018-09-11 DIAGNOSIS — Z308 Encounter for other contraceptive management: Secondary | ICD-10-CM | POA: Diagnosis not present

## 2018-09-11 DIAGNOSIS — M549 Dorsalgia, unspecified: Secondary | ICD-10-CM | POA: Diagnosis not present

## 2018-09-11 DIAGNOSIS — Z793 Long term (current) use of hormonal contraceptives: Secondary | ICD-10-CM | POA: Diagnosis not present

## 2018-09-11 DIAGNOSIS — Z79899 Other long term (current) drug therapy: Secondary | ICD-10-CM | POA: Diagnosis not present

## 2018-09-11 DIAGNOSIS — F909 Attention-deficit hyperactivity disorder, unspecified type: Secondary | ICD-10-CM | POA: Diagnosis not present

## 2018-09-27 DIAGNOSIS — M4125 Other idiopathic scoliosis, thoracolumbar region: Secondary | ICD-10-CM | POA: Diagnosis not present

## 2018-10-03 MED FILL — CONCERTA 36 MG TABLET ER: 36 | 30 days supply | Qty: 60 | Fill #0

## 2018-11-08 MED FILL — CONCERTA 36 MG TABLET ER: 36 | 30 days supply | Qty: 60 | Fill #0

## 2018-12-12 DIAGNOSIS — Z79899 Other long term (current) drug therapy: Secondary | ICD-10-CM | POA: Diagnosis not present

## 2018-12-12 DIAGNOSIS — F909 Attention-deficit hyperactivity disorder, unspecified type: Secondary | ICD-10-CM | POA: Diagnosis not present

## 2018-12-12 DIAGNOSIS — M549 Dorsalgia, unspecified: Secondary | ICD-10-CM | POA: Diagnosis not present

## 2018-12-12 DIAGNOSIS — M419 Scoliosis, unspecified: Secondary | ICD-10-CM | POA: Diagnosis not present

## 2018-12-31 DIAGNOSIS — N3090 Cystitis, unspecified without hematuria: Secondary | ICD-10-CM | POA: Diagnosis not present

## 2018-12-31 DIAGNOSIS — K5900 Constipation, unspecified: Secondary | ICD-10-CM | POA: Diagnosis not present

## 2018-12-31 DIAGNOSIS — R109 Unspecified abdominal pain: Secondary | ICD-10-CM | POA: Diagnosis not present

## 2019-02-14 ENCOUNTER — Ambulatory Visit (INDEPENDENT_AMBULATORY_CARE_PROVIDER_SITE_OTHER): Payer: 59

## 2019-02-14 ENCOUNTER — Ambulatory Visit (HOSPITAL_COMMUNITY)
Admission: EM | Admit: 2019-02-14 | Discharge: 2019-02-14 | Disposition: A | Discharge status: Home / Self Care | Payer: 59 | Attending: Family Medicine | Admitting: Family Medicine

## 2019-02-14 ENCOUNTER — Other Ambulatory Visit: Payer: Self-pay

## 2019-02-14 DIAGNOSIS — R2232 Localized swelling, mass and lump, left upper limb: Secondary | ICD-10-CM

## 2019-02-14 DIAGNOSIS — S61221A Laceration with foreign body of left index finger without damage to nail, initial encounter: Secondary | ICD-10-CM

## 2019-02-14 DIAGNOSIS — W2210XA Striking against or struck by unspecified automobile airbag, initial encounter: Secondary | ICD-10-CM

## 2019-02-14 DIAGNOSIS — T24112A Burn of first degree of left thigh, initial encounter: Secondary | ICD-10-CM | POA: Diagnosis not present

## 2019-02-14 DIAGNOSIS — T24109A Burn of first degree of unspecified site of unspecified lower limb, except ankle and foot, initial encounter: Secondary | ICD-10-CM | POA: Diagnosis not present

## 2019-02-14 DIAGNOSIS — W2211XA Striking against or struck by driver side automobile airbag, initial encounter: Secondary | ICD-10-CM | POA: Diagnosis not present

## 2019-02-14 DIAGNOSIS — T2210XA Burn of first degree of shoulder and upper limb, except wrist and hand, unspecified site, initial encounter: Secondary | ICD-10-CM | POA: Diagnosis not present

## 2019-02-14 DIAGNOSIS — S6992XA Unspecified injury of left wrist, hand and finger(s), initial encounter: Secondary | ICD-10-CM | POA: Diagnosis not present

## 2019-02-14 DIAGNOSIS — M79645 Pain in left finger(s): Secondary | ICD-10-CM | POA: Diagnosis not present

## 2019-02-14 DIAGNOSIS — R600 Localized edema: Secondary | ICD-10-CM | POA: Diagnosis not present

## 2019-02-14 DIAGNOSIS — T148XXA Other injury of unspecified body region, initial encounter: Secondary | ICD-10-CM

## 2019-02-14 MED ORDER — SILVER SULFADIAZINE 1 % EX CREA: 1.0000 "application " | TOPICAL_CREAM | Freq: Two times a day (BID) | CUTANEOUS | 1 refills | Status: DC
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
## 2019-02-14 NOTE — Discharge Instructions (Addendum)
Contact [facility] office on Monday to schedule a follow-up appointment.
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
## 2019-02-17 DIAGNOSIS — S63631A Sprain of interphalangeal joint of left index finger, initial encounter: Secondary | ICD-10-CM | POA: Diagnosis not present

## 2019-02-27 DIAGNOSIS — S63631D Sprain of interphalangeal joint of left index finger, subsequent encounter: Secondary | ICD-10-CM | POA: Diagnosis not present

## 2019-03-13 DIAGNOSIS — S63631D Sprain of interphalangeal joint of left index finger, subsequent encounter: Secondary | ICD-10-CM | POA: Diagnosis not present

## 2019-03-19 DIAGNOSIS — Z308 Encounter for other contraceptive management: Secondary | ICD-10-CM | POA: Diagnosis not present

## 2019-03-19 DIAGNOSIS — M549 Dorsalgia, unspecified: Secondary | ICD-10-CM | POA: Diagnosis not present

## 2019-03-19 DIAGNOSIS — F909 Attention-deficit hyperactivity disorder, unspecified type: Secondary | ICD-10-CM | POA: Diagnosis not present

## 2019-03-19 DIAGNOSIS — M419 Scoliosis, unspecified: Secondary | ICD-10-CM | POA: Diagnosis not present

## 2019-03-19 DIAGNOSIS — Z79899 Other long term (current) drug therapy: Secondary | ICD-10-CM | POA: Diagnosis not present

## 2019-03-19 DIAGNOSIS — Z793 Long term (current) use of hormonal contraceptives: Secondary | ICD-10-CM | POA: Diagnosis not present

## 2019-05-29 DIAGNOSIS — Z6828 Body mass index (BMI) 28.0-28.9, adult: Secondary | ICD-10-CM | POA: Diagnosis not present

## 2019-05-29 DIAGNOSIS — Z113 Encounter for screening for infections with a predominantly sexual mode of transmission: Secondary | ICD-10-CM | POA: Diagnosis not present

## 2019-05-29 DIAGNOSIS — Z8041 Family history of malignant neoplasm of ovary: Secondary | ICD-10-CM | POA: Diagnosis not present

## 2019-05-29 DIAGNOSIS — Z01419 Encounter for gynecological examination (general) (routine) without abnormal findings: Secondary | ICD-10-CM | POA: Diagnosis not present

## 2019-06-19 DIAGNOSIS — Z809 Family history of malignant neoplasm, unspecified: Secondary | ICD-10-CM | POA: Diagnosis not present

## 2020-11-08 ENCOUNTER — Encounter: Payer: Self-pay | Admitting: Obstetrics and Gynecology

## 2021-09-06 NOTE — Telephone Encounter (Signed)
error 

## 2021-11-28 LAB — OB RESULTS CONSOLE ABO/RH: RH Type: POSITIVE

## 2021-11-28 LAB — OB RESULTS CONSOLE HIV ANTIBODY (ROUTINE TESTING): HIV: NONREACTIVE

## 2021-11-28 LAB — OB RESULTS CONSOLE RPR: RPR: NONREACTIVE

## 2021-11-28 LAB — OB RESULTS CONSOLE RUBELLA ANTIBODY, IGM: Rubella: IMMUNE

## 2021-11-28 LAB — OB RESULTS CONSOLE GC/CHLAMYDIA
Chlamydia: NEGATIVE
Neisseria Gonorrhea: NEGATIVE

## 2021-11-28 LAB — OB RESULTS CONSOLE HEPATITIS B SURFACE ANTIGEN: Hepatitis B Surface Ag: NEGATIVE

## 2021-12-02 ENCOUNTER — Encounter (HOSPITAL_BASED_OUTPATIENT_CLINIC_OR_DEPARTMENT_OTHER): Payer: BC Managed Care – PPO

## 2022-04-03 ENCOUNTER — Inpatient Hospital Stay (HOSPITAL_COMMUNITY)
Admission: AD | Admit: 2022-04-03 | Discharge: 2022-04-03 | Disposition: A | Discharge status: Home / Self Care | Payer: BC Managed Care – PPO | Attending: Obstetrics and Gynecology | Admitting: Obstetrics and Gynecology

## 2022-04-03 ENCOUNTER — Encounter (HOSPITAL_COMMUNITY): Payer: Self-pay

## 2022-04-03 DIAGNOSIS — O26892 Other specified pregnancy related conditions, second trimester: Secondary | ICD-10-CM | POA: Diagnosis not present

## 2022-04-03 DIAGNOSIS — Z3A25 25 weeks gestation of pregnancy: Secondary | ICD-10-CM | POA: Diagnosis not present

## 2022-04-03 DIAGNOSIS — R102 Pelvic and perineal pain: Secondary | ICD-10-CM | POA: Insufficient documentation

## 2022-04-03 DIAGNOSIS — N949 Unspecified condition associated with female genital organs and menstrual cycle: Secondary | ICD-10-CM | POA: Diagnosis not present

## 2022-04-03 LAB — URINALYSIS, ROUTINE W REFLEX MICROSCOPIC
Bilirubin Urine: NEGATIVE
Glucose, UA: NEGATIVE mg/dL
Hgb urine dipstick: NEGATIVE
Ketones, ur: NEGATIVE mg/dL
Nitrite: NEGATIVE
Protein, ur: NEGATIVE mg/dL
Specific Gravity, Urine: 1.006 (ref 1.005–1.030)
pH: 7 (ref 5.0–8.0)

## 2022-04-03 NOTE — MAU Provider Note (Signed)
?History  ?  ? ?CSN: [id number] ? ?Arrival date and time: 04/03/22 0743 ? ? Event Date/Time  ? First Provider Initiated Contact with Patient 04/03/22 0914   ?  ? ?Chief Complaint  ?Patient presents with  ? Abdominal Pain  ? ?HPI ?26 year old G1, P0 at 25 weeks and 5 days who presents with lower abdominal pain that started last night when she laid down.  Worse when up and moving around.  She was a bit more active on Saturday and walk around during a car show.  No other palliating or provoking factors.  Has not been using abdominal support band.  Also reports decreased fetal movement. ? ?OB History   ? ? Gravida  ?1  ? Para  ?   ? Term  ?   ? Preterm  ?   ? AB  ?   ? Living  ?   ?  ? ? SAB  ?   ? IAB  ?   ? Ectopic  ?   ? Multiple  ?   ? Live Births  ?   ?   ?  ?  ? ? ?Past Medical History:  ?Diagnosis Date  ? ADHD   ? Headache   ? Scoliosis   ? ? ?Past Surgical History:  ?Procedure Laterality Date  ? MOUTH SURGERY  2017  ? ? ?Family History  ?Problem Relation Age of Onset  ? Migraines Mother   ? Multiple sclerosis Mother   ? ? ?Social History  ? ?Tobacco Use  ? Smoking status: Never  ? Smokeless tobacco: Never  ?Vaping Use  ? Vaping Use: Never used  ?Substance Use Topics  ? Alcohol use: No  ? Drug use: No  ? ? ?Allergies:  ?Allergies  ?Allergen Reactions  ? Adderall Xr [Amphetamine-Dextroamphet Er]   ? Penicillins Hives  ? ? ?Medications Prior to Admission  ?Medication Sig Dispense Refill Last Dose  ? Prenatal Vit-Fe Fumarate-FA (PRENATAL MULTIVITAMIN) TABS tablet Take 1 tablet by mouth daily at 12 noon.   04/03/2022  ? ASHLYNA 0.15-0.03 &0.01 MG tablet Take 1 tablet by mouth daily.  6   ? clotrimazole-betamethasone (LOTRISONE) cream Apply to affected area 2 times daily 15 g 1   ? fluticasone (FLONASE) 50 MCG/ACT nasal spray Place 1 spray into both nostrils daily. 15.8 g 0   ? ibuprofen (ADVIL,MOTRIN) 800 MG tablet Take 1 tablet (800 mg total) by mouth every 8 (eight) hours as needed for mild pain. 30 tablet 0   ?  methocarbamol (ROBAXIN) 500 MG tablet Take 500 mg by mouth 2 (two) times daily.     ? methylphenidate 36 MG PO CR tablet Take 72 mg by mouth 2 (two) times daily.   0   ? pseudoephedrine (SUDAFED) 60 MG tablet Take 1 tablet (60 mg total) by mouth every 6 (six) hours as needed for congestion. 30 tablet 0   ? silver sulfADIAZINE (SILVADENE) 1 % cream Apply 1 application topically 2 (two) times daily. Clean site of wound and apply cream. 100 g 1   ? ? ?Review of Systems ?Physical Exam  ? ?Blood pressure 95/72, pulse 91, temperature 98.9 ?F (37.2 ?C), temperature source Oral, resp. rate 16, height 5' 4.5" (1.638 m), weight 81.2 kg, SpO2 99 %. ? ?Physical Exam ?Vitals reviewed.  ?Constitutional:   ?   Appearance: She is well-developed.  ?HENT:  ?   Head: Normocephalic and atraumatic.  ?Skin: ?   General: Skin is warm and dry.  ?  Capillary Refill: Capillary refill takes less than 2 seconds.  ?Neurological:  ?   General: No focal deficit present.  ?   Mental Status: She is alert and oriented to person, place, and time.  ?Psychiatric:     ?   Mood and Affect: Mood normal.     ?   Behavior: Behavior normal.  ? ? ?MAU Course  ?Procedures ?NST:  ?Baseline: 140s  ?Variability: moderate ?Accelerations: 10x10  ?Decelerations: none ?Contractions: none ? ? ?MDM ? ?Assessment and Plan  ? ?1. [redacted] weeks gestation of pregnancy   ?2. Round ligament pain   ? ?Discussed abdominal support band.  Approximately 10 fetal movements felt in the past 20 minutes.  Discharge to home with reassurance. ? ?Levie Heritage ?04/03/2022, 9:19 AM  ?

## 2022-04-03 NOTE — MAU Note (Signed)
.  Kathy Kane is a 26 y.o. at [redacted]w[redacted]d here in MAU reporting: lower sharp pain in her lower abdomen that started last night. She states that it is constant and is worse when she lays on her sides. Denies VB or LOF. DFM for the last x2 days. No recent intercourse.  ? ?Pain score: 8 ?Vitals:  ? 04/03/22 0758  ?BP: 112/64  ?Pulse: 85  ?Resp: 16  ?Temp: 98.9 ?F (37.2 ?C)  ?SpO2: 99%  ?   ?FHT:144 ?Lab orders placed from triage: UA  ?

## 2022-04-04 LAB — URINE CULTURE

## 2022-04-20 DIAGNOSIS — Z419 Encounter for procedure for purposes other than remedying health state, unspecified: Secondary | ICD-10-CM | POA: Diagnosis not present

## 2022-05-20 DIAGNOSIS — Z419 Encounter for procedure for purposes other than remedying health state, unspecified: Secondary | ICD-10-CM | POA: Diagnosis not present

## 2022-06-07 ENCOUNTER — Encounter (HOSPITAL_COMMUNITY)
Admission: RE | Admit: 2022-06-07 | Discharge: 2022-06-07 | Disposition: A | Discharge status: Home / Self Care | Payer: BC Managed Care – PPO | Source: Ambulatory Visit | Attending: Obstetrics and Gynecology | Admitting: Obstetrics and Gynecology

## 2022-06-07 DIAGNOSIS — O99012 Anemia complicating pregnancy, second trimester: Secondary | ICD-10-CM | POA: Diagnosis present

## 2022-06-07 MED ORDER — FERUMOXYTOL INJECTION 510 MG/17 ML
510.0000 mg | INTRAVENOUS | Status: DC
  Administered 2022-06-07: 510 mg via INTRAVENOUS
  Filled 2022-06-07: qty 510

## 2022-06-12 ENCOUNTER — Inpatient Hospital Stay (HOSPITAL_COMMUNITY): Admission: RE | Admit: 2022-06-12 | Payer: BC Managed Care – PPO | Source: Ambulatory Visit

## 2022-06-12 ENCOUNTER — Inpatient Hospital Stay (HOSPITAL_COMMUNITY)
Admission: AD | Admit: 2022-06-12 | Discharge: 2022-06-13 | Disposition: A | Discharge status: Home / Self Care | Payer: BC Managed Care – PPO | Attending: Obstetrics and Gynecology | Admitting: Obstetrics and Gynecology

## 2022-06-12 ENCOUNTER — Other Ambulatory Visit: Payer: Self-pay

## 2022-06-12 ENCOUNTER — Encounter (HOSPITAL_COMMUNITY): Payer: Self-pay | Admitting: Obstetrics and Gynecology

## 2022-06-12 DIAGNOSIS — O26893 Other specified pregnancy related conditions, third trimester: Secondary | ICD-10-CM | POA: Insufficient documentation

## 2022-06-12 DIAGNOSIS — Z3A35 35 weeks gestation of pregnancy: Secondary | ICD-10-CM | POA: Diagnosis not present

## 2022-06-12 DIAGNOSIS — M545 Low back pain, unspecified: Secondary | ICD-10-CM

## 2022-06-12 DIAGNOSIS — R102 Pelvic and perineal pain: Secondary | ICD-10-CM | POA: Diagnosis not present

## 2022-06-12 LAB — WET PREP, GENITAL
Clue Cells Wet Prep HPF POC: NONE SEEN
Sperm: NONE SEEN
Trich, Wet Prep: NONE SEEN
WBC, Wet Prep HPF POC: <10 (ref <10)
Yeast Wet Prep HPF POC: NONE SEEN

## 2022-06-12 LAB — URINALYSIS, ROUTINE W REFLEX MICROSCOPIC
Bilirubin Urine: NEGATIVE
Glucose, UA: NEGATIVE mg/dL
Hgb urine dipstick: NEGATIVE
Ketones, ur: NEGATIVE mg/dL
Nitrite: NEGATIVE
Protein, ur: NEGATIVE mg/dL
Specific Gravity, Urine: 1.014 (ref 1.005–1.030)
pH: 6 (ref 5.0–8.0)

## 2022-06-12 MED ORDER — ONDANSETRON 4 MG PO TBDP
8.0000 mg | ORAL_TABLET | Freq: Once | ORAL | Status: AC
  Administered 2022-06-12: 8 mg via ORAL

## 2022-06-12 MED ORDER — ONDANSETRON 4 MG PO TBDP
ORAL_TABLET | ORAL | Status: AC
  Filled 2022-06-12: qty 2

## 2022-06-12 NOTE — MAU Note (Signed)
Kathy Kane is a 26 y.o. at [redacted]w[redacted]d here in MAU reporting: pain from lower abdomen that has been constant.. started last night but today started to wrap around to her lower back. Reports baby doesn't move a lot usually but has not felt movement since earlier today. Denies VB or LOF. Called on call doctor and was told to come to MAU. Reports had iron infusion on Wednesday and states it made her feel bad and has had some chest pains "every now and then" since.. last felt it in the car on the way here, but none currently.   Pain score: 7 Vitals:   06/12/22 2139  BP: 122/74  Pulse: 100  Resp: 18  Temp: 98.3 F (36.8 C)  SpO2: 97%    FHT:142 Lab orders placed from triage: u/a

## 2022-06-12 NOTE — MAU Provider Note (Signed)
Chief Complaint:  Abdominal Pain   Event Date/Time   First Provider Initiated Contact with Patient 06/12/22 2239     HPI: Kathy Kane is a 26 y.o. G1P0 at [redacted]w[redacted]d who presents to maternity admissions reporting increased pelvic pressure and low back pain that became exacerbated yesterday. This pelvic pressure is not new, just worse than usual nad has eased up considerably since arriving to MAU. Has not taken anything for pain, does not want to take anything here. Did complain of some nausea and requested meds. Denies vaginal bleeding, leaking of fluid, decreased fetal movement, fever, falls, or recent illness.   Pregnancy Course: Receives care at [facility], states she was written out of work at [employer] for preterm labor and has been getting weekly cervical exams to assess for dilation but is always closed.   Past Medical History:  Diagnosis Date   ADHD    Headache    Scoliosis    OB History  Gravida Para Term Preterm AB Living  1            SAB IAB Ectopic Multiple Live Births               # Outcome Date GA Lbr Len/2nd Weight Sex Delivery Anes PTL Lv  1 Current            Past Surgical History:  Procedure Laterality Date   MOUTH SURGERY  2017   Family History  Problem Relation Age of Onset   Migraines Mother    Multiple sclerosis Mother    Social History   Tobacco Use   Smoking status: Never   Smokeless tobacco: Never  Vaping Use   Vaping Use: Never used  Substance Use Topics   Alcohol use: No   Drug use: No   Allergies  Allergen Reactions   Adderall Xr [Amphetamine-Dextroamphet Er]     Causes tachycardia   Penicillins Hives   No medications prior to admission.   I have reviewed patient's Past Medical Hx, Surgical Hx, Family Hx, Social Hx, medications and allergies.   ROS:  Pertinent items noted in HPI and remainder of comprehensive ROS otherwise negative.   Physical Exam  Patient Vitals for the past 24 hrs:  BP Temp Temp src Pulse Resp SpO2  Height Weight  06/12/22 2331 124/60 98.5 F (36.9 C) Oral 98 (!) 117 -- -- --  06/12/22 2139 122/74 98.3 F (36.8 C) Oral 100 18 97 % 5\' 4"  (1.626 m) 187 lb 8 oz (85 kg)   Constitutional: Well-developed, well-nourished female in no acute distress.  Cardiovascular: normal rate & rhythm Respiratory: normal effort GI: Abd soft, non-tender, gravid appropriate for gestational age MS: Extremities nontender, no edema, normal ROM Neurologic: Alert and oriented x 4.  GU: no CVA tenderness Pelvic: NEFG, physiologic discharge, no blood  Dilation: Closed Effacement (%): 80 Cervical Position: Posterior Station: -1 Presentation: Vertex Exam by:: Gaylan Gerold CNM   Fetal Tracing: reactive Baseline: 135 Variability: moderate Accelerations: 15x15 Decelerations: none Toco: relaxed, some mild UI   Labs: Results for orders placed or performed during the hospital encounter of 06/12/22 (from the past 24 hour(s))  Urinalysis, Routine w reflex microscopic Urine, Clean Catch     Status: Abnormal   Collection Time: 06/12/22  9:46 PM  Result Value Ref Range   Color, Urine YELLOW YELLOW   APPearance CLOUDY (A) CLEAR   Specific Gravity, Urine 1.014 1.005 - 1.030   pH 6.0 5.0 - 8.0   Glucose, UA NEGATIVE  NEGATIVE mg/dL   Hgb urine dipstick NEGATIVE NEGATIVE   Bilirubin Urine NEGATIVE NEGATIVE   Ketones, ur NEGATIVE NEGATIVE mg/dL   Protein, ur NEGATIVE NEGATIVE mg/dL   Nitrite NEGATIVE NEGATIVE   Leukocytes,Ua MODERATE (A) NEGATIVE   RBC / HPF 0-5 0 - 5 RBC/hpf   WBC, UA 11-20 0 - 5 WBC/hpf   Bacteria, UA FEW (A) NONE SEEN   Squamous Epithelial / LPF 21-50 0 - 5   Mucus PRESENT    Amorphous Crystal PRESENT   Fetal fibronectin     Status: None   Collection Time: 06/12/22 11:34 PM  Result Value Ref Range   Fetal Fibronectin NEGATIVE NEGATIVE  Wet prep, genital     Status: None   Collection Time: 06/12/22 11:34 PM   Specimen: Vaginal  Result Value Ref Range   Yeast Wet Prep HPF POC NONE  SEEN NONE SEEN   Trich, Wet Prep NONE SEEN NONE SEEN   Clue Cells Wet Prep HPF POC NONE SEEN NONE SEEN   WBC, Wet Prep HPF POC <10 <10   Sperm NONE SEEN   Group B strep by PCR     Status: None   Collection Time: 06/12/22 11:34 PM   Specimen: Vaginal; Genital  Result Value Ref Range   Group B strep by PCR NEGATIVE NEGATIVE   Imaging:  No results found.  MAU Course: Orders Placed This Encounter  Procedures   Wet prep, genital   Group B strep by PCR   Urinalysis, Routine w reflex microscopic Urine, Clean Catch   Fetal fibronectin   Discharge patient   Meds ordered this encounter  Medications   ondansetron (ZOFRAN-ODT) disintegrating tablet 8 mg   ondansetron (ZOFRAN-ODT) 4 MG disintegrating tablet    Daphine Deutscher, Dea F: cabinet override   cyclobenzaprine (FLEXERIL) 10 MG tablet    Sig: Take 1 tablet (10 mg total) by mouth every 8 (eight) hours as needed for muscle spasms.    Dispense:  30 tablet    Refill:  1    Order Specific Question:   Supervising Provider    Answer:   Samara Snide   MDM: Zofran given with good relief of nausea.  Collected FFN, wet prep, gc/ct and GBS prior to cervical exam. Cervix still closed but head very low. Encouraged pt to use a support band at home, also suggested flexeril at bedtime which patient accepted.   Assessment: 1. Low back pain during pregnancy in third trimester   2. Pelvic pain affecting pregnancy in third trimester, antepartum   3. [redacted] weeks gestation of pregnancy    Plan: Discharge home in stable condition with preterm labor precautions     Follow-up Information     [facility], [facility] Follow up.   Why: as scheduled for ongoing prenatal care Contact information: [address] [place] [address] [phone]                 Allergies as of 06/13/2022       Reactions   Adderall Xr [amphetamine-dextroamphet Er]    Causes tachycardia   Penicillins Hives        Medication  List     TAKE these medications    clotrimazole-betamethasone cream Commonly known as: LOTRISONE Apply to affected area 2 times daily   cyclobenzaprine 10 MG tablet Commonly known as: FLEXERIL Take 1 tablet (10 mg total) by mouth every 8 (eight) hours as needed for muscle spasms.   fluticasone 50  MCG/ACT nasal spray Commonly known as: FLONASE Place 1 spray into both nostrils daily.   prenatal multivitamin Tabs tablet Take 1 tablet by mouth daily at 12 noon.       Edd Arbour, CNM, MSN, IBCLC Certified Nurse Midwife, [facility]

## 2022-06-13 DIAGNOSIS — M545 Low back pain, unspecified: Secondary | ICD-10-CM

## 2022-06-13 DIAGNOSIS — O26893 Other specified pregnancy related conditions, third trimester: Secondary | ICD-10-CM | POA: Diagnosis not present

## 2022-06-13 DIAGNOSIS — Z3A35 35 weeks gestation of pregnancy: Secondary | ICD-10-CM

## 2022-06-13 LAB — GC/CHLAMYDIA PROBE AMP (~~LOC~~) NOT AT ARMC
Chlamydia: NEGATIVE
Comment: NEGATIVE
Comment: NORMAL
Neisseria Gonorrhea: NEGATIVE

## 2022-06-13 LAB — GROUP B STREP BY PCR: Group B strep by PCR: NEGATIVE

## 2022-06-13 LAB — FETAL FIBRONECTIN: Fetal Fibronectin: NEGATIVE

## 2022-06-13 MED ORDER — CYCLOBENZAPRINE HCL 10 MG PO TABS: 10.0000 mg | ORAL_TABLET | Freq: Three times a day (TID) | ORAL | 1 refills | Status: DC | PRN

## 2022-06-14 ENCOUNTER — Encounter (HOSPITAL_COMMUNITY): Payer: BC Managed Care – PPO

## 2022-06-15 ENCOUNTER — Encounter (HOSPITAL_COMMUNITY): Payer: Self-pay | Admitting: Obstetrics and Gynecology

## 2022-06-15 ENCOUNTER — Observation Stay (EMERGENCY_DEPARTMENT_HOSPITAL)
Admission: AD | Admit: 2022-06-15 | Discharge: 2022-06-16 | Disposition: A | Discharge status: Home / Self Care | Payer: BC Managed Care – PPO | Source: Home / Self Care | Attending: Obstetrics and Gynecology | Admitting: Obstetrics and Gynecology

## 2022-06-15 DIAGNOSIS — Z3A36 36 weeks gestation of pregnancy: Secondary | ICD-10-CM | POA: Diagnosis not present

## 2022-06-15 DIAGNOSIS — O4703 False labor before 37 completed weeks of gestation, third trimester: Secondary | ICD-10-CM | POA: Insufficient documentation

## 2022-06-15 DIAGNOSIS — O4700 False labor before 37 completed weeks of gestation, unspecified trimester: Secondary | ICD-10-CM | POA: Diagnosis present

## 2022-06-15 LAB — TYPE AND SCREEN
ABO/RH(D): B POS
Antibody Screen: NEGATIVE

## 2022-06-15 MED ORDER — PRENATAL MULTIVITAMIN CH: 1.0000 | ORAL_TABLET | Freq: Every day | ORAL | Status: DC

## 2022-06-15 MED ORDER — BUTORPHANOL TARTRATE 1 MG/ML IJ SOLN: 1.0000 mg | INTRAMUSCULAR | Status: DC | PRN

## 2022-06-15 MED ORDER — DOCUSATE SODIUM 100 MG PO CAPS: 100.0000 mg | ORAL_CAPSULE | Freq: Every day | ORAL | Status: DC

## 2022-06-15 MED ORDER — OXYCODONE HCL 5 MG PO TABS
10.0000 mg | ORAL_TABLET | Freq: Once | ORAL | Status: AC
  Administered 2022-06-15: 10 mg via ORAL
  Filled 2022-06-15: qty 2

## 2022-06-15 MED ORDER — OXYCODONE HCL 5 MG PO TABS: 10.0000 mg | ORAL_TABLET | ORAL | Status: DC | PRN

## 2022-06-15 MED ORDER — ZOLPIDEM TARTRATE 5 MG PO TABS: 5.0000 mg | ORAL_TABLET | Freq: Every evening | ORAL | Status: DC | PRN

## 2022-06-15 MED ORDER — BETAMETHASONE SOD PHOS & ACET 6 (3-3) MG/ML IJ SUSP
12.0000 mg | INTRAMUSCULAR | Status: DC
  Administered 2022-06-15: 12 mg via INTRAMUSCULAR
  Filled 2022-06-15: qty 5

## 2022-06-15 MED ORDER — CALCIUM CARBONATE ANTACID 500 MG PO CHEW: 2.0000 | CHEWABLE_TABLET | ORAL | Status: DC | PRN

## 2022-06-15 MED ORDER — LACTATED RINGERS IV BOLUS
500.0000 mL | Freq: Once | INTRAVENOUS | Status: AC
  Administered 2022-06-15: 500 mL via INTRAVENOUS

## 2022-06-15 MED ORDER — ACETAMINOPHEN 500 MG PO TABS: 1000.0000 mg | ORAL_TABLET | Freq: Four times a day (QID) | ORAL | Status: DC | PRN

## 2022-06-15 NOTE — MAU Provider Note (Signed)
History     [id number]  Arrival date and time: 06/15/22 2841    Chief Complaint  Patient presents with   Contractions     HPI Kathy Kane is a 26 y.o. at [redacted]w[redacted]d with PMHx notable for frequent preterm contractions in this pregnancy, who presents for contractions.   Patient reports that she has been contracting consistently since last night Endorses good fetal movement No leaking or vaginal bleeding       OB History     Gravida  1   Para      Term      Preterm      AB      Living         SAB      IAB      Ectopic      Multiple      Live Births              Past Medical History:  Diagnosis Date   ADHD    Headache    Scoliosis     Past Surgical History:  Procedure Laterality Date   MOUTH SURGERY  2017    Family History  Problem Relation Age of Onset   Migraines Mother    Multiple sclerosis Mother     Social History   Socioeconomic History   Marital status: Single    Spouse name: Not on file   Number of children: 0   Years of education: 12   Highest education level: Not on file  Occupational History   Occupation: Unemployed  Tobacco Use   Smoking status: Never   Smokeless tobacco: Never  Vaping Use   Vaping Use: Never used  Substance and Sexual Activity   Alcohol use: No   Drug use: No   Sexual activity: Not Currently  Other Topics Concern   Not on file  Social History Narrative   Lives with  Mother, dad, and sister   Caffeine use: none   Social Determinants of Health   Financial Resource Strain: Not on file  Food Insecurity: Not on file  Transportation Needs: Not on file  Physical Activity: Not on file  Stress: Not on file  Social Connections: Not on file  Intimate Partner Violence: Not on file    Allergies  Allergen Reactions   Adderall Xr [Amphetamine-Dextroamphet Er]     Causes tachycardia   Penicillins Hives    No current facility-administered medications on file prior to encounter.   Current  Outpatient Medications on File Prior to Encounter  Medication Sig Dispense Refill   cyclobenzaprine (FLEXERIL) 10 MG tablet Take 1 tablet (10 mg total) by mouth every 8 (eight) hours as needed for muscle spasms. 30 tablet 1   Prenatal Vit-Fe Fumarate-FA (PRENATAL MULTIVITAMIN) TABS tablet Take 1 tablet by mouth daily at 12 noon.     clotrimazole-betamethasone (LOTRISONE) cream Apply to affected area 2 times daily 15 g 1   fluticasone (FLONASE) 50 MCG/ACT nasal spray Place 1 spray into both nostrils daily. 15.8 g 0     ROS Pertinent positives and negative per HPI, all others reviewed and negative  Physical Exam   BP 121/66 (BP Location: Right Arm)   Pulse 100   Temp 97.8 F (36.6 C) (Oral)   Resp 20   Ht 5\' 4"  (1.626 m)   Wt 85 kg   SpO2 99%   BMI 32.15 kg/m   Patient Vitals for the past 24 hrs:  BP Temp Temp src Pulse Resp  SpO2 Height Weight  06/15/22 0919 121/66 -- -- 100 -- 99 % 5\' 4"  (1.626 m) 85 kg  06/15/22 0915 -- 97.8 F (36.6 C) Oral -- 20 -- -- --    Physical Exam Vitals reviewed.  Constitutional:      General: She is not in acute distress.    Appearance: She is well-developed. She is not diaphoretic.  Eyes:     General: No scleral icterus. Pulmonary:     Effort: Pulmonary effort is normal. No respiratory distress.  Abdominal:     General: There is no distension.     Palpations: Abdomen is soft.     Tenderness: There is no abdominal tenderness. There is no guarding or rebound.  Skin:    General: Skin is warm and dry.  Neurological:     Mental Status: She is alert.     Coordination: Coordination normal.     Cervical Exam Dilation: 2.5 Effacement (%): 70, 80 Station: 0 Presentation: Vertex Exam by:: [email or web address], RN  Bedside Ultrasound Not done  My interpretation: n/a  FHT Baseline 140, moderate variability, +accels, no decels Toco: irritability Cat: I  Labs No results found for this or any previous visit (from the past 24  hour(s)).  Imaging No results found.  MAU Course  Procedures  Lab Orders         POCT fern test    Meds ordered this encounter  Medications   oxyCODONE (Oxy IR/ROXICODONE) immediate release tablet 10 mg   acetaminophen (TYLENOL) tablet 1,000 mg   zolpidem (AMBIEN) tablet 5 mg   docusate sodium (COLACE) capsule 100 mg   calcium carbonate (TUMS - dosed in mg elemental calcium) chewable tablet 400 mg of elemental calcium   prenatal multivitamin tablet 1 tablet   betamethasone acetate-betamethasone sodium phosphate (CELESTONE) injection 12 mg   lactated ringers bolus 500 mL   butorphanol (STADOL) injection 1 mg   oxyCODONE (Oxy IR/ROXICODONE) immediate release tablet 10 mg   Imaging Orders  No imaging studies ordered today    MDM moderate  Assessment and Plan  #Preterm contractions #[redacted] weeks gestation of pregnancy Has made cervical change since last MAU visit and is very uncomfortable. She also lives about an hour away. Discussed with Dr. Georgina Snell who is in agreement with admission for obs.   #FWB FHT Cat I NST: Reactive   Dispo: admit to [facility], care turned over to [facility].    Kathy Spanner, MD/MPH 06/15/22 1:29 PM

## 2022-06-15 NOTE — H&P (Addendum)
Kathy Kane is a 26 y.o. female presenting for uterine contractions. She was closed a few days ago. FFN was negative. However, she presented today with strong contractions. Oxycodone improved her pain but contractions still present.  No cervical change in several hours. Still 2.5cm. However, given patient lives an hour away and still reporting pain not well controlled, will admit overnight for observation to see if labor progresses. Membranes are intact and no vaginal bleeding.  She has a history of ADD and scoliosis. She reports taking no medications.    OB History     Gravida  1   Para      Term      Preterm      AB      Living         SAB      IAB      Ectopic      Multiple      Live Births             Past Medical History:  Diagnosis Date   ADHD    Headache    Scoliosis    Past Surgical History:  Procedure Laterality Date   MOUTH SURGERY  2017   Family History: family history includes Migraines in her mother; Multiple sclerosis in her mother. Social History:  reports that she has never smoked. She has never used smokeless tobacco. She reports that she does not drink alcohol and does not use drugs.     Maternal Diabetes: No Genetic Screening: Normal Maternal Ultrasounds/Referrals: Normal Fetal Ultrasounds or other Referrals:  None Maternal Substance Abuse:  No Significant Maternal Medications:  None Significant Maternal Lab Results:  None Number of Prenatal Visits:greater than 3 verified prenatal visits Other Comments:  None  Review of Systems History Dilation: 2.5 Effacement (%): 70, 80 Station: 0 Exam by:: Georgina Snell, RN Blood pressure 121/66, pulse 100, temperature 97.8 F (36.6 C), temperature source Oral, resp. rate 20, height 5\' 4"  (1.626 m), weight 85 kg, SpO2 99 %. Exam Physical Exam  NAD, A&O NWOB Abd soft, nondistended, gravid, acontractile during my encounter SVE 2.5/80/0  x 2  Prenatal labs: ABO, Rh:  B+ Antibody:   Neg Rubella:   RPR:   NR HBsAg:   NR HIV:   NR GBS: NEGATIVE/-- (07/24 2334)   Assessment/Plan: 26 yo G1P0 presenting at 36.1 wga with PTCs.  No cervical change here in several hours. However, was closed 3 days ago. Admit for observation. Defer tocolysis given > 34 wga. BMZ for FLM Pain control GBS negative 06/12/22   06/14/22 06/15/2022, 1:16 PM

## 2022-06-15 NOTE — MAU Note (Signed)
.  Kathy Kane is a 26 y.o. at [redacted]w[redacted]d here in MAU reporting: ctx every 5 minutes since 2100 last night. She reports she also lost her mucous plug, but denies VB or LOF. Reports good FM.    Onset of complaint: 2100 Pain score: 10 Vitals:   06/15/22 0915 06/15/22 0919  BP:  121/66  Pulse:  100  Resp: 20   Temp: 97.8 F (36.6 C)   SpO2:  99%     FHT:152 Lab orders placed from triage:  mau labor

## 2022-06-16 NOTE — Discharge Summary (Signed)
Physician Discharge Summary  Patient ID: Kathy Kane MRN: [id number] DOB/AGE: 13-Oct-1996 26 y.o.  Admit date: 06/15/2022 Discharge date: 06/16/2022  Admission Diagnoses:preterm contractions  Discharge Diagnoses:  Principal Problem:   Preterm contractions   Discharged Condition: good  Hospital Course: presents to MAU with UCs and intact BOW. FHT is cat one and cervix is 2/90/-1/vtx. UCs resolve and cervix does not change. She is observed overnight in MAU because she lives almost an hour from hospital. This am she is comfortable, vital signs are normal, FHT are cat one and contractions are mild and sporadic. Her cervix is unchanged. D/W options. She is agreeable to go home and will stay with her mother who lives closer to hospital. Instructions reviewed.  Consults: None  Significant Diagnostic Studies: labs:  Results for orders placed or performed during the hospital encounter of 06/15/22 (from the past 24 hour(s))  Type and screen [facility]     Status: None   Collection Time: 06/15/22  1:56 PM  Result Value Ref Range   ABO/RH(D) B POS    Antibody Screen NEG    Sample Expiration      06/18/2022,2359 Performed at [facility], [address], [place], [place] [address]      Treatments: observation  Discharge Exam: Blood pressure (!) 102/51, pulse 95, temperature 98.5 F (36.9 C), temperature source Oral, resp. rate 17, height 5\' 4"  (1.626 m), weight 85 kg, SpO2 96 %. General appearance: alert, cooperative, and no distress GI: gravid, NT  Disposition: Discharge disposition: 01-Home or Self Care        Allergies as of 06/16/2022       Reactions   Adderall Xr [amphetamine-dextroamphet Er]    Causes tachycardia   Penicillins Hives        Medication List     STOP taking these medications    clotrimazole-betamethasone cream Commonly known as: LOTRISONE   fluticasone 50 MCG/ACT nasal spray Commonly known as: FLONASE        TAKE these medications    cyclobenzaprine 10 MG tablet Commonly known as: FLEXERIL Take 1 tablet (10 mg total) by mouth every 8 (eight) hours as needed for muscle spasms.   prenatal multivitamin Tabs tablet Take 1 tablet by mouth daily at 12 noon.         Signed: 06/18/2022 II 06/16/2022, 8:55 AM

## 2022-06-16 NOTE — Plan of Care (Signed)
Patient discharged with PTL instructions to return to MAU if vaginal bleeding, decreased FM, LOF with or without contractions. Patient to f/u in office as scheduled. Patient and FOB verbalizes understanding.

## 2022-06-16 NOTE — Progress Notes (Signed)
36 2/7  No leaking, no bleeding  Today's Vitals   06/16/22 0403 06/16/22 0700 06/16/22 0734 06/16/22 0818  BP:  (!) 101/40 (!) 101/40 (!) 102/51  Pulse:  88 89 95  Resp:  18  17  Temp:  98.5 F (36.9 C)    TempSrc:  Oral    SpO2:  96%    Weight:      Height:      PainSc: Asleep   4    Body mass index is 32.15 kg/m.   Cx 2/90/-1/vtx  FHT cat one UCs irregular/mild  A/P: preterm contractions resolved         D/W rest/fluids         If hard UCs or ROM occurs will return to MAU         D/W current preterm status of baby and no active labor         She states she understands and agrees

## 2022-06-18 ENCOUNTER — Encounter (HOSPITAL_COMMUNITY): Payer: Self-pay | Admitting: Obstetrics and Gynecology

## 2022-06-18 ENCOUNTER — Inpatient Hospital Stay (HOSPITAL_COMMUNITY): Payer: BC Managed Care – PPO | Admitting: Anesthesiology

## 2022-06-18 ENCOUNTER — Inpatient Hospital Stay (HOSPITAL_COMMUNITY)
Admission: AD | Admit: 2022-06-18 | Discharge: 2022-06-20 | DRG: 807 | Disposition: A | Discharge status: Home / Self Care | Payer: BC Managed Care – PPO | Attending: Obstetrics and Gynecology | Admitting: Obstetrics and Gynecology

## 2022-06-18 DIAGNOSIS — O26893 Other specified pregnancy related conditions, third trimester: Secondary | ICD-10-CM | POA: Diagnosis present

## 2022-06-18 DIAGNOSIS — Z419 Encounter for procedure for purposes other than remedying health state, unspecified: Secondary | ICD-10-CM | POA: Diagnosis not present

## 2022-06-18 DIAGNOSIS — O4700 False labor before 37 completed weeks of gestation, unspecified trimester: Secondary | ICD-10-CM

## 2022-06-18 DIAGNOSIS — Z3A36 36 weeks gestation of pregnancy: Secondary | ICD-10-CM | POA: Diagnosis not present

## 2022-06-18 LAB — CBC
HCT: 33.1 % — ABNORMAL LOW (ref 36.0–46.0)
Hemoglobin: 10.9 g/dL — ABNORMAL LOW (ref 12.0–15.0)
MCH: 28.5 pg (ref 26.0–34.0)
MCHC: 32.9 g/dL (ref 30.0–36.0)
MCV: 86.6 fL (ref 80.0–100.0)
Platelets: 218 10*3/uL (ref 150–400)
RBC: 3.82 MIL/uL — ABNORMAL LOW (ref 3.87–5.11)
RDW: 14.3 % (ref 11.5–15.5)
WBC: 15.3 10*3/uL — ABNORMAL HIGH (ref 4.0–10.5)
nRBC: 0 % (ref 0.0–0.2)

## 2022-06-18 LAB — TYPE AND SCREEN
ABO/RH(D): B POS
Antibody Screen: NEGATIVE

## 2022-06-18 LAB — RPR: RPR Ser Ql: NONREACTIVE

## 2022-06-18 MED ORDER — FLEET ENEMA 7-19 GM/118ML RE ENEM: 1.0000 | ENEMA | RECTAL | Status: DC | PRN

## 2022-06-18 MED ORDER — SENNOSIDES-DOCUSATE SODIUM 8.6-50 MG PO TABS
2.0000 | ORAL_TABLET | ORAL | Status: DC
Start: 2022-06-18 — End: 2022-06-20
  Administered 2022-06-19: 2 via ORAL
  Filled 2022-06-18 (×3): qty 2

## 2022-06-18 MED ORDER — DIBUCAINE (PERIANAL) 1 % EX OINT: 1.0000 | TOPICAL_OINTMENT | CUTANEOUS | Status: DC | PRN

## 2022-06-18 MED ORDER — IBUPROFEN 600 MG PO TABS
600.0000 mg | ORAL_TABLET | Freq: Four times a day (QID) | ORAL | Status: DC
  Administered 2022-06-18 – 2022-06-20 (×9): 600 mg via ORAL
  Filled 2022-06-18 (×10): qty 1

## 2022-06-18 MED ORDER — LACTATED RINGERS IV SOLN: INTRAVENOUS | Status: DC

## 2022-06-18 MED ORDER — ONDANSETRON HCL 4 MG PO TABS
4.0000 mg | ORAL_TABLET | ORAL | Status: DC | PRN
  Administered 2022-06-18: 4 mg via ORAL
  Filled 2022-06-18: qty 1

## 2022-06-18 MED ORDER — ONDANSETRON HCL 4 MG/2ML IJ SOLN: 4.0000 mg | Freq: Four times a day (QID) | INTRAMUSCULAR | Status: DC | PRN

## 2022-06-18 MED ORDER — OXYTOCIN-SODIUM CHLORIDE 30-0.9 UT/500ML-% IV SOLN
1.0000 m[IU]/min | INTRAVENOUS | Status: DC
  Administered 2022-06-18: 2 m[IU]/min via INTRAVENOUS

## 2022-06-18 MED ORDER — LACTATED RINGERS IV SOLN
500.0000 mL | INTRAVENOUS | Status: DC | PRN
  Administered 2022-06-18: 500 mL via INTRAVENOUS

## 2022-06-18 MED ORDER — OXYCODONE HCL 5 MG PO TABS: 10.0000 mg | ORAL_TABLET | ORAL | Status: DC | PRN

## 2022-06-18 MED ORDER — LIDOCAINE HCL (PF) 1 % IJ SOLN
INTRAMUSCULAR | Status: DC | PRN
  Administered 2022-06-18: 10 mL via EPIDURAL

## 2022-06-18 MED ORDER — SIMETHICONE 80 MG PO CHEW
80.0000 mg | CHEWABLE_TABLET | ORAL | Status: DC | PRN
  Administered 2022-06-19: 80 mg via ORAL
  Filled 2022-06-18: qty 1

## 2022-06-18 MED ORDER — OXYCODONE HCL 5 MG PO TABS: 5.0000 mg | ORAL_TABLET | ORAL | Status: DC | PRN

## 2022-06-18 MED ORDER — TERBUTALINE SULFATE 1 MG/ML IJ SOLN: 0.2500 mg | Freq: Once | INTRAMUSCULAR | Status: DC | PRN

## 2022-06-18 MED ORDER — TETANUS-DIPHTH-ACELL PERTUSSIS 5-2.5-18.5 LF-MCG/0.5 IM SUSY: 0.5000 mL | PREFILLED_SYRINGE | Freq: Once | INTRAMUSCULAR | Status: DC

## 2022-06-18 MED ORDER — SOD CITRATE-CITRIC ACID 500-334 MG/5ML PO SOLN: 30.0000 mL | ORAL | Status: DC | PRN

## 2022-06-18 MED ORDER — OXYTOCIN-SODIUM CHLORIDE 30-0.9 UT/500ML-% IV SOLN
2.5000 [IU]/h | INTRAVENOUS | Status: DC
  Filled 2022-06-18: qty 500

## 2022-06-18 MED ORDER — PHENYLEPHRINE 80 MCG/ML (10ML) SYRINGE FOR IV PUSH (FOR BLOOD PRESSURE SUPPORT): 80.0000 ug | PREFILLED_SYRINGE | INTRAVENOUS | Status: DC | PRN

## 2022-06-18 MED ORDER — DIPHENHYDRAMINE HCL 50 MG/ML IJ SOLN: 12.5000 mg | INTRAMUSCULAR | Status: DC | PRN

## 2022-06-18 MED ORDER — PRENATAL MULTIVITAMIN CH
1.0000 | ORAL_TABLET | Freq: Every day | ORAL | Status: DC
  Administered 2022-06-19 – 2022-06-20 (×2): 1 via ORAL
  Filled 2022-06-18 (×2): qty 1

## 2022-06-18 MED ORDER — FENTANYL CITRATE (PF) 100 MCG/2ML IJ SOLN
50.0000 ug | INTRAMUSCULAR | Status: DC | PRN
  Administered 2022-06-18: 100 ug via INTRAVENOUS
  Filled 2022-06-18: qty 2

## 2022-06-18 MED ORDER — OXYCODONE-ACETAMINOPHEN 5-325 MG PO TABS: 1.0000 | ORAL_TABLET | ORAL | Status: DC | PRN

## 2022-06-18 MED ORDER — OXYTOCIN BOLUS FROM INFUSION
333.0000 mL | Freq: Once | INTRAVENOUS | Status: AC
  Administered 2022-06-18: 333 mL via INTRAVENOUS

## 2022-06-18 MED ORDER — DIPHENHYDRAMINE HCL 25 MG PO CAPS: 25.0000 mg | ORAL_CAPSULE | Freq: Four times a day (QID) | ORAL | Status: DC | PRN

## 2022-06-18 MED ORDER — ACETAMINOPHEN 325 MG PO TABS: 650.0000 mg | ORAL_TABLET | ORAL | Status: DC | PRN

## 2022-06-18 MED ORDER — ZOLPIDEM TARTRATE 5 MG PO TABS: 5.0000 mg | ORAL_TABLET | Freq: Every evening | ORAL | Status: DC | PRN

## 2022-06-18 MED ORDER — EPHEDRINE 5 MG/ML INJ: 10.0000 mg | INTRAVENOUS | Status: DC | PRN

## 2022-06-18 MED ORDER — WITCH HAZEL-GLYCERIN EX PADS
1.0000 | MEDICATED_PAD | CUTANEOUS | Status: DC | PRN
  Administered 2022-06-19: 1 via TOPICAL

## 2022-06-18 MED ORDER — FENTANYL-BUPIVACAINE-NACL 0.5-0.125-0.9 MG/250ML-% EP SOLN
12.0000 mL/h | EPIDURAL | Status: DC | PRN
  Administered 2022-06-18: 12 mL/h via EPIDURAL
  Filled 2022-06-18: qty 250

## 2022-06-18 MED ORDER — COCONUT OIL OIL: 1.0000 | TOPICAL_OIL | Status: DC | PRN

## 2022-06-18 MED ORDER — BENZOCAINE-MENTHOL 20-0.5 % EX AERO
1.0000 | INHALATION_SPRAY | CUTANEOUS | Status: DC | PRN
  Administered 2022-06-18: 1 via TOPICAL
  Filled 2022-06-18: qty 56

## 2022-06-18 MED ORDER — OXYCODONE-ACETAMINOPHEN 5-325 MG PO TABS: 2.0000 | ORAL_TABLET | ORAL | Status: DC | PRN

## 2022-06-18 MED ORDER — ONDANSETRON HCL 4 MG/2ML IJ SOLN: 4.0000 mg | INTRAMUSCULAR | Status: DC | PRN

## 2022-06-18 MED ORDER — OXYTOCIN-SODIUM CHLORIDE 30-0.9 UT/500ML-% IV SOLN: 1.0000 m[IU]/min | INTRAVENOUS | Status: DC

## 2022-06-18 MED ORDER — LIDOCAINE HCL (PF) 1 % IJ SOLN: 30.0000 mL | INTRAMUSCULAR | Status: DC | PRN

## 2022-06-18 MED ORDER — LACTATED RINGERS IV SOLN
500.0000 mL | Freq: Once | INTRAVENOUS | Status: AC
  Administered 2022-06-18: 500 mL via INTRAVENOUS

## 2022-06-18 NOTE — Lactation Note (Signed)
This note was copied from a baby's chart. Lactation Consultation Note  Patient Name: Kathy Kane [id number] Date: 06/18/2022 Reason for consult: Follow-up assessment;RN request;Primapara;1st time breastfeeding;Late-preterm 34-36.6wks;Breastfeeding assistance Age:24 hours  P1, Late Preterm, Infant Female  LC entered the room and baby was asleep in the bassinet. Per the birthing parent, breastfeeding is going fine. LC spoke with the parents about the Late Preterm Infant Feeding policy and set up a DEBP. LC spoke with the parents about putting baby to the breast prior to bottle feeding, formula feeding, pumping frequency, washing pump parts, and milk storage guidelines.   The birthing parent is currently using a size #21 flange, but she states that she uses a #17 at home. LC let the parent know that we do not have flanges smaller than a #21, but she can always use her smaller flanges when she returns home.   LC also spoke with the birthing parent about supply and demand and cluster feeding. Per the birthing parent, she has been taught how to hand express.   Current Feeding Plan:  Breastfeed according to feeding cues 8+ times in 24 hours.  Supplement with formula according to [facility] feeding policy.  Pump after each feed for 15 min.  Keep the total feeding to less than 30 min.  Call RN/LC for assistance with breastfeeding.   Maternal Data Has patient been taught Hand Expression?: Yes Does the patient have breastfeeding experience prior to this delivery?: No  Feeding Mother's Current Feeding Choice: Breast Milk and Formula  LATCH Score Latch: Too sleepy or reluctant, no latch achieved, no sucking elicited.  Audible Swallowing: None  Type of Nipple: Flat  Comfort (Breast/Nipple): Soft / non-tender  Hold (Positioning): Assistance needed to correctly position infant at breast and maintain latch.  LATCH Score: 4   Lactation Tools Discussed/Used Tools: Pump Breast pump type:  Double-Electric Breast Pump Pump Education: Setup, frequency, and cleaning;Milk Storage Reason for Pumping: Late Pre-term infant  Interventions Interventions: Breast feeding basics reviewed;DEBP;Education;LC Services brochure;LPT handout/interventions  Discharge Pump: DEBP;Hands Free;Personal (Lansinoh, 2 Wireless Pumps, and a Spectra)  Consult Status Consult Status: Follow-up Date: 06/19/22 Follow-up type: In-patient    Orvil Feil Markea Ruzich 06/18/2022, 3:07 PM

## 2022-06-18 NOTE — Lactation Note (Signed)
This note was copied from a baby's chart. Lactation Consultation Note  Patient Name: Boy Vaishali Baise WYOVZ'C Date: 06/18/2022 Reason for consult: Follow-up assessment;Difficult latch;Primapara;1st time breastfeeding;Late-preterm 34-36.6wks;Breastfeeding assistance Age:24 hours  P1, Late Preterm, Infant Female  Baby at 8 hours.   LC entered the room and the birthing parent was holding baby. She states that baby only took 1mL of formula and had an emesis episode on the non birthing parent. The birthing parent recently pumped and put her expressed breast milk in the fridge.   Per the birthing parent, she is happy that she was able to pump some milk for her baby.   LC praised the birthing parent for her efforts and encouraged her to keep expressing her milk. The LC let the birthing parent know that the goal is pumping 8 times in 24 hours, but she also needs to rest today.   The birthing parent states that she has not been able to get the baby to latch. She states that he sits with the nipple in his mouth or spits it out of his mouth.   LC encouraged the parents to call RN/LC for breastfeeding assistance.   Maternal Data Has patient been taught Hand Expression?: Yes Does the patient have breastfeeding experience prior to this delivery?: No  Feeding Mother's Current Feeding Choice: Breast Milk and Formula Nipple Type: Nfant Slow Flow (purple)   Lactation Tools Discussed/Used Tools: Pump Breast pump type: Double-Electric Breast Pump Pump Education: Setup, frequency, and cleaning;Milk Storage Reason for Pumping: Late Pre-term infant  Interventions Interventions: Education;Breast feeding basics reviewed  Discharge Pump: DEBP;Hands Free;Personal (Lansinoh, 2 Wireless Pumps, and a Spectra)  Consult Status Consult Status: Follow-up Date: 06/19/22 Follow-up type: In-patient    Orvil Feil Jahsiah Carpenter 06/18/2022, 6:02 PM

## 2022-06-18 NOTE — Progress Notes (Signed)
Delivery Note At 9:10 AM a viable female was delivered via Vaginal, Spontaneous (Presentation: Right Occiput Anterior).  APGAR: 8, 9; weight  .   Placenta status: Spontaneous, Intact.  Cord: 3 vessels with the following complications: None. Placenta to path Cord pH: pending  Anesthesia: Epidural Episiotomy: None Lacerations:  bilateral periurethral lacerations repaired, small midline second degree laceration repaired Suture Repair: 2.0 vicryl rapide Est. Blood Loss (mL):  150  Mom to postpartum.  Baby to Couplet care / Skin to Skin.  Roselle Locus II 06/18/2022, 9:28 AM

## 2022-06-18 NOTE — Lactation Note (Signed)
This note was copied from a baby's chart. Lactation Consultation Note  Patient Name: Kathy Kane [id number]'Y Date: 06/18/2022 Reason for consult: L&D Initial assessment;Primapara;1st time breastfeeding;Breastfeeding assistance;Mother's request Age:24 hours  P1, Late Preterm, Infant Female  LC entered the room and baby "[name]" was STS with the birthing parent.   LC assisted the birthing parent with latching baby to the left breast in the laid back position. Baby sounded congested and coughed a few times during the feed.   Per the birthing parent, she has been leaking breast milk for weeks.   Baby [name] latched deeply with lips flanged, swallows were noted, and the birthing parent denied any pain.   All questions were answered. The parents are aware that they will be seen on MBU.  Maternal Data Does the patient have breastfeeding experience prior to this delivery?: No  Feeding Mother's Current Feeding Choice: Breast Milk  LATCH Score Latch: Grasps breast easily, tongue down, lips flanged, rhythmical sucking.  Audible Swallowing: A few with stimulation  Type of Nipple: Everted at rest and after stimulation  Comfort (Breast/Nipple): Soft / non-tender  Hold (Positioning): Assistance needed to correctly position infant at breast and maintain latch.  LATCH Score: 8   Lactation Tools Discussed/Used    Interventions Interventions: Breast feeding basics reviewed;Assisted with latch;Adjust position;Education  Discharge    Consult Status Consult Status: Follow-up from L&D Date: 06/18/22 Follow-up type: In-patient    Helina Hullum P Aalliyah Kilker 06/18/2022, 10:00 AM

## 2022-06-18 NOTE — MAU Note (Signed)
Pt held in MAU for LD staffing unavailable.Explained to pt.

## 2022-06-18 NOTE — Lactation Note (Signed)
This note was copied from a baby's chart. Lactation Consultation Note Birthing parent states baby is sleepy not latching well. Birthing parent is using DEBP and giving EBM in bottle. Birthing parent doesn't have any questions at this time. States baby is spitting up and sleepy not wanting to latch to breast so they are giving colostrum in bottle. Birthing parent states she is going to be pumping and bottle feeding as well. Encouraged rest while baby is resting. Praised parents for good feeding plan.  Patient Name: Kathy Kane [id number] Date: 06/18/2022 Reason for consult: Mother's request;Primapara;Late-preterm 34-36.6wks Age:19 hours  Maternal Data Has patient been taught Hand Expression?: Yes Does the patient have breastfeeding experience prior to this delivery?: No  Feeding Nipple Type: Nfant Slow Flow (purple)  LATCH Score       Type of Nipple: Everted at rest and after stimulation  Comfort (Breast/Nipple): Soft / non-tender         Lactation Tools Discussed/Used    Interventions    Discharge    Consult Status Consult Status: Follow-up Date: 06/19/22 Follow-up type: In-patient    Charyl Dancer 06/18/2022, 11:09 PM

## 2022-06-18 NOTE — Lactation Note (Signed)
This note was copied from a baby's chart. Lactation Consultation Note  Patient Name: Kathy Kane [id number] Date: 06/18/2022   Age:24 hours  LC attempted to visit with the dyad, but the RN was in the room. Will return later to speak with the birthing person.   Maternal Data    Feeding    LATCH Score                    Lactation Tools Discussed/Used    Interventions    Discharge    Consult Status      Delene Loll 06/18/2022, 1:41 PM

## 2022-06-18 NOTE — Anesthesia Preprocedure Evaluation (Signed)
Anesthesia Evaluation  Patient identified by MRN, date of birth, ID band Patient awake    Reviewed: Allergy & Precautions, H&P , Patient's Chart, lab work & pertinent test results  Airway Mallampati: I       Dental no notable dental hx.    Pulmonary neg pulmonary ROS,    Pulmonary exam normal        Cardiovascular negative cardio ROS Normal cardiovascular exam     Neuro/Psych    GI/Hepatic negative GI ROS, Neg liver ROS,   Endo/Other  negative endocrine ROS  Renal/GU negative Renal ROS  negative genitourinary   Musculoskeletal negative musculoskeletal ROS (+)   Abdominal (+) + obese,   Peds  Hematology  (+) Blood dyscrasia, anemia ,   Anesthesia Other Findings   Reproductive/Obstetrics (+) Pregnancy                             Anesthesia Physical Anesthesia Plan  ASA: 2  Anesthesia Plan: Epidural   Post-op Pain Management:    Induction:   PONV Risk Score and Plan:   Airway Management Planned:   Additional Equipment:   Intra-op Plan:   Post-operative Plan:   Informed Consent: I have reviewed the patients History and Physical, chart, labs and discussed the procedure including the risks, benefits and alternatives for the proposed anesthesia with the patient or authorized representative who has indicated his/her understanding and acceptance.       Plan Discussed with:   Anesthesia Plan Comments:         Anesthesia Quick Evaluation

## 2022-06-18 NOTE — MAU Note (Signed)
..  Kathy Kane is a 26 y.o. at [redacted]w[redacted]d here in MAU reporting: She has had contractions for a few hours that feel constant because they are so close. Denies vaginal bleeding or leaking of fluid. +FM  Pain score: 10 Vitals:   06/18/22 0118  BP: 121/67  Pulse: (!) 106  Resp: 18  Temp: 98.3 F (36.8 C)  SpO2: 97%     FHT:169

## 2022-06-18 NOTE — Progress Notes (Signed)
FHT cat one Cx 5/C/0/vtx AROM clear UCs q3-5 min

## 2022-06-18 NOTE — Anesthesia Procedure Notes (Signed)
Epidural Patient location during procedure: OB Start time: 06/18/2022 3:50 AM End time: 06/18/2022 3:54 AM  Staffing Anesthesiologist: Leilani Able, MD Performed: anesthesiologist   Preanesthetic Checklist Completed: patient identified, IV checked, site marked, risks and benefits discussed, surgical consent, monitors and equipment checked, pre-op evaluation and timeout performed  Epidural Patient position: sitting Prep: DuraPrep and site prepped and draped Patient monitoring: continuous pulse ox and blood pressure Approach: midline Location: L3-L4 Injection technique: LOR air  Needle:  Needle type: Tuohy  Needle gauge: 17 G Needle length: 9 cm and 9 Needle insertion depth: 6 cm Catheter type: closed end flexible Catheter size: 19 Gauge Catheter at skin depth: 11 cm Test dose: negative and Other  Assessment Events: blood not aspirated, injection not painful, no injection resistance, no paresthesia and negative IV test  Additional Notes Reason for block:procedure for pain

## 2022-06-18 NOTE — H&P (Signed)
Kathy Kane is a 26 y.o. female presenting for UCs. She was admitted for observation with PT UCs 7/27-7/28/23. Now returns with hard contractions. Pregnancy complicated by a Hx of ADHD-no meds and scoliosis. OB History     Gravida  1   Para      Term      Preterm      AB      Living         SAB      IAB      Ectopic      Multiple      Live Births             Past Medical History:  Diagnosis Date   ADHD    Headache    Scoliosis    Past Surgical History:  Procedure Laterality Date   MOUTH SURGERY  2017   Family History: family history includes Migraines in her mother; Multiple sclerosis in her mother. Social History:  reports that she has never smoked. She has never used smokeless tobacco. She reports that she does not drink alcohol and does not use drugs.     Maternal Diabetes: No Genetic Screening: Normal Maternal Ultrasounds/Referrals: Normal Fetal Ultrasounds or other Referrals:  None Maternal Substance Abuse:  No Significant Maternal Medications:  None Significant Maternal Lab Results:  Group B Strep negative Number of Prenatal Visits:greater than 3 verified prenatal visits Other Comments:  None  Review of Systems  Constitutional:  Negative for fever.  Eyes:  Negative for visual disturbance.  Gastrointestinal:  Negative for abdominal pain.  Neurological:  Negative for headaches.   Maternal Medical History:  Fetal activity: Perceived fetal activity is normal.     Dilation: 5 Effacement (%): 90 Station: 0 Exam by:: Ginnie Smart RN Blood pressure 121/67, pulse (!) 106, temperature 98.3 F (36.8 C), temperature source Oral, resp. rate 18, height 5\' 4"  (1.626 m), weight 85 kg, SpO2 97 %. Maternal Exam:  Abdomen: Fetal presentation: vertex   Fetal Exam Fetal State Assessment: Category I - tracings are normal.   Physical Exam Cardiovascular:     Rate and Rhythm: Normal rate.  Pulmonary:     Effort: Pulmonary effort is normal.      Prenatal labs: ABO, Rh: --/--/B POS (07/27 1356) Antibody: NEG (07/27 1356) Rubella:   RPR:    HBsAg:    HIV:    GBS: NEGATIVE/-- (07/24 2334)   Assessment/Plan: 26 yo G1P0 @ 36 4/7 wks in preterm labor Admit to L&D   6/7 II 06/18/2022, 2:16 AM

## 2022-06-19 LAB — CBC
HCT: 29.6 % — ABNORMAL LOW (ref 36.0–46.0)
Hemoglobin: 9.6 g/dL — ABNORMAL LOW (ref 12.0–15.0)
MCH: 28.9 pg (ref 26.0–34.0)
MCHC: 32.4 g/dL (ref 30.0–36.0)
MCV: 89.2 fL (ref 80.0–100.0)
Platelets: 195 10*3/uL (ref 150–400)
RBC: 3.32 MIL/uL — ABNORMAL LOW (ref 3.87–5.11)
RDW: 14.5 % (ref 11.5–15.5)
WBC: 11.2 10*3/uL — ABNORMAL HIGH (ref 4.0–10.5)
nRBC: 0 % (ref 0.0–0.2)

## 2022-06-19 NOTE — Progress Notes (Signed)
PPD # 1  Doing well. Circ on hold per Nursery.  BP (!) 113/56 (BP Location: Left Arm)   Pulse 72   Temp 98.2 F (36.8 C) (Oral)   Resp 18   Ht 5\' 4"  (1.626 m)   Wt 85 kg   SpO2 99%   Breastfeeding Unknown   BMI 32.18 kg/m  Results for orders placed or performed during the hospital encounter of 06/18/22 (from the past 24 hour(s))  CBC     Status: Abnormal   Collection Time: 06/19/22  4:44 AM  Result Value Ref Range   WBC 11.2 (H) 4.0 - 10.5 K/uL   RBC 3.32 (L) 3.87 - 5.11 MIL/uL   Hemoglobin 9.6 (L) 12.0 - 15.0 g/dL   HCT 06/21/22 (L) 80.2 - 23.3 %   MCV 89.2 80.0 - 100.0 fL   MCH 28.9 26.0 - 34.0 pg   MCHC 32.4 30.0 - 36.0 g/dL   RDW 61.2 24.4 - 97.5 %   Platelets 195 150 - 400 K/uL   nRBC 0.0 0.0 - 0.2 %   Lochia WNL Uterus is firm  PPD # 1  Doing well Routine care Circ tomorrow Discharge home tomorrow

## 2022-06-19 NOTE — Lactation Note (Signed)
This note was copied from a baby's chart. Lactation Consultation Note  Patient Name: Kathy Kane [id number] Date: 06/19/2022 Reason for consult: Follow-up assessment;Mother's request;Difficult latch;Late-preterm 34-36.6wks;Nipple pain/trauma;Breastfeeding assistance Age:24 hours  Birth parent following feeding plan established by SLP to increase volume with help of Dr. Manson Passey ultra preemie nipple.   Birth parent stating nipple pain with use of 21 flanges. LC explained need 24 flange but will reassess once swelling goes down. LC provided brochure and pictures on how assessment of flange size should look like.   Birth parent to call for latch assistance and flange assessment, later today.   Maternal Data Has patient been taught Hand Expression?: Yes  Feeding Mother's Current Feeding Choice: Breast Milk and Formula Nipple Type: Dr. Cline Crock  LATCH Score                    Lactation Tools Discussed/Used Tools: Pump;Flanges;Other (comment) (lanolin) Flange Size: 21 (Birth parent using 21 flange, really need 24. Birth parent states feeling sore. LC reviewed flange sizes with her and will return to assess once swelling decreases.) Breast pump type: Double-Electric Breast Pump Pump Education: Setup, frequency, and cleaning;Milk Storage Reason for Pumping: increase stimulation Pumping frequency: post pump after feeding  Interventions Interventions: Breast feeding basics reviewed;Expressed milk;DEBP;Education;Visual merchandiser education  Discharge    Consult Status Consult Status: Follow-up Date: 06/20/22 Follow-up type: In-patient    Denielle Bayard  Nicholson-Springer 06/19/2022, 1:41 PM

## 2022-06-19 NOTE — Lactation Note (Signed)
This note was copied from a baby's chart. Lactation Consultation Note  Patient Name: Boy Eadie Repetto PHXTA'V Date: 06/19/2022 Reason for consult: Follow-up assessment;Primapara;1st time breastfeeding;Late-preterm 34-36.6wks;Infant weight loss;Mother's request;RN request (1% weight loss, per mom was calling to be shown how to use the hand pump and have the flanged checked.) Age:24 hours See below for assessment of the flange size.  LC encouraged mom to call with feeding cues for Latch assessment.  Maternal Data    Feeding Mother's Current Feeding Choice: Breast Milk and Formula   Lactation Tools Discussed/Used Tools: Shells;Pump;Flanges Flange Size: 21;Other (comment) (LC checked the flange #21 and with the hand pump, LC felt the sizing was good and mom felt it had some discomfort.. mom expressed she still needs a lower size than 21 , down to #17. ( #17 not available at this hospital )) Breast pump type: Manual;Other (comment) (per mom the DEBP is to painful even on level 3)  Interventions Interventions: Breast feeding basics reviewed;Education  Discharge    Consult Status Consult Status: Follow-up Date: 06/20/22 Follow-up type: In-patient    Matilde Sprang Obe Ahlers 06/19/2022, 6:24 PM

## 2022-06-20 LAB — SURGICAL PATHOLOGY

## 2022-06-20 NOTE — Anesthesia Postprocedure Evaluation (Signed)
Anesthesia Post Note  Patient: Kathy Kane  Procedure(s) Performed: AN AD HOC LABOR EPIDURAL     Patient location during evaluation: Mother Baby Anesthesia Type: Epidural Level of consciousness: awake and alert Pain management: pain level controlled Vital Signs Assessment: post-procedure vital signs reviewed and stable Respiratory status: spontaneous breathing, nonlabored ventilation and respiratory function stable Cardiovascular status: stable Postop Assessment: no headache, no backache and epidural receding Anesthetic complications: no   No notable events documented.  Last Vitals:  Vitals:   06/19/22 2025 06/20/22 0530  BP: 111/71 107/74  Pulse: 75 81  Resp: 16 18  Temp: 36.8 C 36.7 C  SpO2: 100% 100%    Last Pain:  Vitals:   06/20/22 0850  TempSrc:   PainSc: 7    Pain Goal: Patients Stated Pain Goal: 2 (06/20/22 0850)                 Fanny Dance

## 2022-06-20 NOTE — Discharge Summary (Signed)
Postpartum Discharge Summary      Patient Name: Kathy Kane DOB: [birth year] MRN: [id number]  Date of admission: 06/18/2022 Delivery date:06/18/2022  Delivering provider: Everlene Farrier  Date of discharge: 06/20/2022  Admitting diagnosis: Preterm labor [O60.00] Intrauterine pregnancy: [redacted]w[redacted]d    Secondary diagnosis:  Principal Problem:   Preterm labor  Additional problems: None   Discharge diagnosis: Preterm Pregnancy Delivered                                              Post partum procedures: None Augmentation: AROM Complications: None  Hospital course: Onset of Labor With Vaginal Delivery      26y.o. yo G1P0101 at 318w4das admitted in Active Labor on 06/18/2022. Patient had an uncomplicated labor course as follows:  Membrane Rupture Time/Date: 4:08 AM ,06/18/2022   Delivery Method:Vaginal, Spontaneous  Episiotomy: None  Lacerations:  Periurethral  Patient had an uncomplicated postpartum course.  She is ambulating, tolerating a regular diet, passing flatus, and urinating well. Patient is discharged home in stable condition on 06/20/22.  Newborn Data: Birth date:06/18/2022  Birth time:9:10 AM  Gender:Female  Living status:Living  Apgars:8 ,9  Weight:2740 g   Magnesium Sulfate received: No BMZ received: Yes Rhophylac:N/A MMR:N/A T-DaP:Given prenatally Flu: N/A Transfusion:No  Physical exam  Vitals:   06/19/22 0600 06/19/22 1352 06/19/22 2025 06/20/22 0530  BP: (!) 113/56 116/68 111/71 107/74  Pulse: 72 76 75 81  Resp: _0 Temp: 98.2 F (36.8 C) 98.4 F (36.9 C) 98.2 F (36.8 C) 98 F (36.7 C)  TempSrc: Oral Oral Oral Oral  SpO2: 99% 99% 100% 100%  Weight:      Height:       General: alert Lochia: appropriate Uterine Fundus: firm Incision: N/A DVT Evaluation: No evidence of DVT seen on physical exam. Labs: Lab Results  Component Value Date   WBC 11.2 (H) 06/19/2022   HGB 9.6 (L) 06/19/2022   HCT 29.6 (L) 06/19/2022   MCV 89.2  06/19/2022   PLT 195 06/19/2022      Latest Ref Rng & Units 05/10/2017   11:17 PM  CMP  Glucose 65 - 99 mg/dL 88   BUN 6 - 20 mg/dL 8   Creatinine 0.44 - 1.00 mg/dL 0.62   Sodium 135 - 145 mmol/L 135   Potassium 3.5 - 5.1 mmol/L 3.8   Chloride 101 - 111 mmol/L 103   CO2 22 - 32 mmol/L 23   Calcium 8.9 - 10.3 mg/dL 9.2    Edinburgh Score:    06/19/2022    5:59 AM  Edinburgh Postnatal Depression Scale Screening Tool  I have been able to laugh and see the funny side of things. 0  I have looked forward with enjoyment to things. 0  I have blamed myself unnecessarily when things went wrong. 3  I have been anxious or worried for no good reason. 2  I have felt scared or panicky for no good reason. 1  Things have been getting on top of me. 1  I have been so unhappy that I have had difficulty sleeping. 1  I have felt sad or miserable. 0  I have been so unhappy that I have been crying. 0  The thought of harming myself has occurred to me. 0  Edinburgh Postnatal Depression Scale Total 8  After visit meds:  Allergies as of 06/20/2022       Reactions   Adderall Xr [amphetamine-dextroamphet Er] Other (See Comments)   Causes tachycardia   Penicillins Hives        Medication List     STOP taking these medications    cyclobenzaprine 10 MG tablet Commonly known as: FLEXERIL       TAKE these medications    prenatal multivitamin Tabs tablet Take 1 tablet by mouth daily at 12 noon.         Discharge home in stable condition Infant Feeding: Bottle and Breast Infant Disposition:home with mother Discharge instruction: per After Visit Summary and Postpartum booklet. Activity: Advance as tolerated. Pelvic rest for 6 weeks.  Diet: routine diet Anticipated Birth Control: Unsure Postpartum Appointment:6 weeks Additional Postpartum F/U:  None Future Appointments:No future appointments. Follow up Visit:      06/20/2022 Tyson Dense, MD

## 2022-06-26 ENCOUNTER — Telehealth (HOSPITAL_COMMUNITY): Payer: Self-pay | Admitting: *Deleted

## 2022-06-26 NOTE — Telephone Encounter (Signed)
Patient voiced no questions or concerns regarding her health at this time. EPDS=2. Patient voiced no questions or concerns regarding infant at this time. Patient reports infant sleeps in a bassinet on his back. RN reviewed ABCs of safe sleep. Patient verbalized understanding. Patient informed about hospital's virtual postpartum classes and support groups. Declined email information at this time. Deforest Hoyles, RN, 06/26/22, [phone]

## 2022-07-12 ENCOUNTER — Inpatient Hospital Stay (HOSPITAL_COMMUNITY): Admit: 2022-07-12 | Payer: BC Managed Care – PPO | Admitting: Obstetrics and Gynecology

## 2022-07-21 DIAGNOSIS — Z419 Encounter for procedure for purposes other than remedying health state, unspecified: Secondary | ICD-10-CM | POA: Diagnosis not present

## 2022-08-20 DIAGNOSIS — Z419 Encounter for procedure for purposes other than remedying health state, unspecified: Secondary | ICD-10-CM | POA: Diagnosis not present

## 2022-09-20 DIAGNOSIS — Z419 Encounter for procedure for purposes other than remedying health state, unspecified: Secondary | ICD-10-CM | POA: Diagnosis not present

## 2022-10-20 DIAGNOSIS — Z419 Encounter for procedure for purposes other than remedying health state, unspecified: Secondary | ICD-10-CM | POA: Diagnosis not present

## 2022-11-20 DIAGNOSIS — Z419 Encounter for procedure for purposes other than remedying health state, unspecified: Secondary | ICD-10-CM | POA: Diagnosis not present

## 2022-12-21 DIAGNOSIS — Z419 Encounter for procedure for purposes other than remedying health state, unspecified: Secondary | ICD-10-CM | POA: Diagnosis not present

## 2023-01-19 DIAGNOSIS — Z419 Encounter for procedure for purposes other than remedying health state, unspecified: Secondary | ICD-10-CM | POA: Diagnosis not present

## 2023-02-19 DIAGNOSIS — Z419 Encounter for procedure for purposes other than remedying health state, unspecified: Secondary | ICD-10-CM | POA: Diagnosis not present

## 2023-03-21 DIAGNOSIS — Z419 Encounter for procedure for purposes other than remedying health state, unspecified: Secondary | ICD-10-CM | POA: Diagnosis not present

## 2023-04-21 DIAGNOSIS — Z419 Encounter for procedure for purposes other than remedying health state, unspecified: Secondary | ICD-10-CM | POA: Diagnosis not present

## 2023-05-21 DIAGNOSIS — Z419 Encounter for procedure for purposes other than remedying health state, unspecified: Secondary | ICD-10-CM | POA: Diagnosis not present

## 2023-06-21 DIAGNOSIS — Z419 Encounter for procedure for purposes other than remedying health state, unspecified: Secondary | ICD-10-CM | POA: Diagnosis not present

## 2023-07-11 ENCOUNTER — Ambulatory Visit
Admission: RE | Admit: 2023-07-11 | Discharge: 2023-07-11 | Disposition: A | Discharge status: Home / Self Care | Payer: Medicaid Other | Source: Ambulatory Visit | Attending: Family Medicine | Admitting: Family Medicine

## 2023-07-11 ENCOUNTER — Other Ambulatory Visit: Payer: Self-pay

## 2023-07-11 VITALS — BP 97/62 | HR 73 | Temp 98.3°F | Resp 20

## 2023-07-11 DIAGNOSIS — J069 Acute upper respiratory infection, unspecified: Secondary | ICD-10-CM | POA: Insufficient documentation

## 2023-07-11 DIAGNOSIS — Z1152 Encounter for screening for COVID-19: Secondary | ICD-10-CM | POA: Insufficient documentation

## 2023-07-11 DIAGNOSIS — J029 Acute pharyngitis, unspecified: Secondary | ICD-10-CM | POA: Diagnosis present

## 2023-07-11 DIAGNOSIS — R059 Cough, unspecified: Secondary | ICD-10-CM | POA: Diagnosis present

## 2023-07-11 LAB — POCT RAPID STREP A (OFFICE): Rapid Strep A Screen: NEGATIVE

## 2023-07-11 NOTE — ED Provider Notes (Signed)
[facility]   [id number] 07/11/23 Arrival Time: 0946  ASSESSMENT & PLAN:  1. Viral URI with cough    Discussed typical duration of likely viral illness. Rapid strep negative. Low susp for strep. COVID testing sent at pt request. OTC symptom care as needed.    Follow-up Information     Pearson Grippe, MD.   Specialty: Internal Medicine Why: As needed. Contact information: [address] [place] [address] [place] [address] [phone]                 Reviewed expectations re: course of current medical issues. Questions answered. Outlined signs and symptoms indicating need for more acute intervention. Understanding verbalized. After Visit Summary given.   SUBJECTIVE: History from: Patient. Kathy Kane is a 27 y.o. female. Pt reports cough, sore throat; fatigued; abrupt onset approx 3 d ago. Denies any known exposures. Inquiring about strep and covid tests. Denies: fever and difficulty breathing. Normal PO intake without n/v/d.  OBJECTIVE:  Vitals:   07/11/23 1024  BP: 97/62  Pulse: 73  Resp: 20  Temp: 98.3 F (36.8 C)  TempSrc: Oral  SpO2: 97%    General appearance: alert; no distress Eyes: PERRLA; EOMI; conjunctiva normal HENT: [place]; AT; with nasal congestion Neck: supple  Lungs: speaks full sentences without difficulty; unlabored; ctab Extremities: no edema Skin: warm and dry Neurologic: normal gait Psychological: alert and cooperative; normal mood and affect  Labs: Results for orders placed or performed during the hospital encounter of 07/11/23  POCT rapid strep A  Result Value Ref Range   Rapid Strep A Screen Negative Negative   Labs Reviewed  SARS CORONAVIRUS 2 (TAT 6-24 HRS)  POCT RAPID STREP A (OFFICE)    Imaging: No results found.  Allergies  Allergen Reactions   Adderall Xr [Amphetamine-Dextroamphet Er] Other (See Comments)    Causes tachycardia   Penicillins Hives    Past Medical History:  Diagnosis Date    ADHD    Headache    Scoliosis    Social History   Socioeconomic History   Marital status: Single    Spouse name: Not on file   Number of children: 0   Years of education: 12   Highest education level: Not on file  Occupational History   Occupation: Unemployed  Tobacco Use   Smoking status: Never   Smokeless tobacco: Never  Vaping Use   Vaping status: Never Used  Substance and Sexual Activity   Alcohol use: No   Drug use: No   Sexual activity: Not Currently    Birth control/protection: Pill  Other Topics Concern   Not on file  Social History Narrative   Lives with  Mother, dad, and sister   Caffeine use: none   Social Determinants of Health   Financial Resource Strain: Not on file  Food Insecurity: Not on file  Transportation Needs: Not on file  Physical Activity: Not on file  Stress: Not on file  Social Connections: Unknown (04/04/2022)   Received from [facility], [facility]   Social Network    Social Network: Not on file  Intimate Partner Violence: Unknown (02/24/2022)   Received from [facility], [facility]   HITS    Physically Hurt: Not on file    Insult or Talk Down To: Not on file    Threaten Physical Harm: Not on file    Scream or Curse: Not on file   Family History  Problem Relation Age of Onset   Migraines Mother  Multiple sclerosis Mother    Past Surgical History:  Procedure Laterality Date   MOUTH SURGERY  2017     Mardella Layman, MD 07/11/23 [phone]

## 2023-07-11 NOTE — ED Triage Notes (Addendum)
Pt reports cough, sore throat since Sunday. Denies any known exposures. Inquiring about strep and covid tests.

## 2023-07-12 LAB — SARS CORONAVIRUS 2 (TAT 6-24 HRS): SARS Coronavirus 2: NEGATIVE

## 2023-07-22 DIAGNOSIS — Z419 Encounter for procedure for purposes other than remedying health state, unspecified: Secondary | ICD-10-CM | POA: Diagnosis not present

## 2023-08-17 DIAGNOSIS — Z124 Encounter for screening for malignant neoplasm of cervix: Secondary | ICD-10-CM | POA: Diagnosis not present

## 2023-08-17 DIAGNOSIS — R87612 Low grade squamous intraepithelial lesion on cytologic smear of cervix (LGSIL): Secondary | ICD-10-CM | POA: Diagnosis not present

## 2023-08-21 DIAGNOSIS — Z419 Encounter for procedure for purposes other than remedying health state, unspecified: Secondary | ICD-10-CM | POA: Diagnosis not present

## 2023-08-30 ENCOUNTER — Ambulatory Visit
Admission: RE | Admit: 2023-08-30 | Discharge: 2023-08-30 | Disposition: A | Discharge status: Home / Self Care | Payer: Medicaid Other | Source: Ambulatory Visit | Attending: Nurse Practitioner | Admitting: Nurse Practitioner

## 2023-08-30 ENCOUNTER — Other Ambulatory Visit: Payer: Self-pay

## 2023-08-30 VITALS — BP 101/66 | HR 90 | Temp 98.5°F | Resp 20

## 2023-08-30 DIAGNOSIS — H60503 Unspecified acute noninfective otitis externa, bilateral: Secondary | ICD-10-CM | POA: Diagnosis not present

## 2023-08-30 MED ORDER — OFLOXACIN 0.3 % OT SOLN: 10.0000 [drp] | Freq: Every day | OTIC | 0 refills | Status: AC

## 2023-08-30 NOTE — ED Triage Notes (Signed)
Pt reports bilateral ear pain and "bloody drainage" since Sunday. Pt reports ringing started last night as well. Denies any known injury. Reports mild improvement of pain with tylenol.

## 2023-08-30 NOTE — Discharge Instructions (Signed)
Start using the ofloxacin drops daily in both ears to help with any infection in your outer ear.  Follow up with ENT if your symptoms do not improve with this treatment.

## 2023-08-30 NOTE — ED Provider Notes (Signed)
[facility]    CSN: [id number] Arrival date & time: 08/30/23  4098      History   Chief Complaint Chief Complaint  Patient presents with   Ear Drainage    Entered by patient    HPI Kathy Kane is a 27 y.o. female.   Patient presents today with 3 day history of bilateral ear ringing and bloody drainage from the left ear.  She denies recent fall, trauma, or injury to the ears.  No recent fever, cough, congestion, or sore throat.  No recent Q-tip use or recent water immersion.  Has not tried putting any ear drops in her ears.     Past Medical History:  Diagnosis Date   ADHD    Headache    Scoliosis     Patient Active Problem List   Diagnosis Date Noted   Preterm labor 06/18/2022   Preterm contractions 06/15/2022   Chest pain, unspecified 06/18/2017   Low back pain 06/18/2017   Attention deficit hyperactivity disorder (ADHD) 10/17/2016    Past Surgical History:  Procedure Laterality Date   MOUTH SURGERY  2017    OB History     Gravida  1   Para  1   Term      Preterm  1   AB      Living  1      SAB      IAB      Ectopic      Multiple  0   Live Births  1            Home Medications    Prior to Admission medications   Medication Sig Start Date End Date Taking? Authorizing Provider  ofloxacin (FLOXIN) 0.3 % OTIC solution Place 10 drops into both ears daily for 7 days. 08/30/23 09/06/23 Yes Valentino Nose, NP  levonorgestrel-ethinyl estradiol (SEASONALE) 0.15-0.03 MG tablet Take 1 tablet by mouth daily.    [provider]    Family History Family History  Problem Relation Age of Onset   Migraines Mother    Multiple sclerosis Mother     Social History Social History   Tobacco Use   Smoking status: Never   Smokeless tobacco: Never  Vaping Use   Vaping status: Never Used  Substance Use Topics   Alcohol use: No   Drug use: No     Allergies   Adderall xr [amphetamine-dextroamphet er] and  Penicillins   Review of Systems Review of Systems Per HPI  Physical Exam Triage Vital Signs ED Triage Vitals  Encounter Vitals Group     BP 08/30/23 1010 101/66     Systolic BP Percentile --      Diastolic BP Percentile --      Pulse Rate 08/30/23 1010 90     Resp 08/30/23 1010 20     Temp 08/30/23 1010 98.5 F (36.9 C)     Temp Source 08/30/23 1010 Oral     SpO2 08/30/23 1010 97 %     Weight --      Height --      Head Circumference --      Peak Flow --      Pain Score 08/30/23 1009 8     Pain Loc --      Pain Education --      Exclude from Growth Chart --    No data found.  Updated Vital Signs BP 101/66 (BP Location: Right Arm)   Pulse 90  Temp 98.5 F (36.9 C) (Oral)   Resp 20   LMP 07/11/2023 Comment: pt reports BC is a "3 month series". Only have period 4 times a year.  SpO2 97%   Breastfeeding No   Visual Acuity Right Eye Distance:   Left Eye Distance:   Bilateral Distance:    Right Eye Near:   Left Eye Near:    Bilateral Near:     Physical Exam Vitals and nursing note reviewed.  Constitutional:      General: She is not in acute distress.    Appearance: Normal appearance. She is not toxic-appearing.  HENT:     Head: Normocephalic and atraumatic.     Right Ear: Tympanic membrane, ear canal and external ear normal. No decreased hearing noted. Tenderness present. No drainage.     Left Ear: Tympanic membrane, ear canal and external ear normal. No decreased hearing noted. Tenderness present. No drainage.     Ears:     Comments: External auditory canals are very slightly erythematous, however exquisitely tenderness to visualization with speculum    Nose: Nose normal. No congestion or rhinorrhea.     Mouth/Throat:     Mouth: Mucous membranes are moist.     Pharynx: Oropharynx is clear.  Pulmonary:     Effort: Pulmonary effort is normal. No respiratory distress.  Musculoskeletal:     Cervical back: Normal range of motion.  Lymphadenopathy:      Cervical: No cervical adenopathy.  Skin:    General: Skin is warm and dry.     Capillary Refill: Capillary refill takes less than 2 seconds.     Coloration: Skin is not jaundiced or pale.     Findings: No erythema.  Neurological:     Mental Status: She is alert and oriented to person, place, and time.     Motor: No weakness.     Gait: Gait normal.  Psychiatric:        Behavior: Behavior is cooperative.      UC Treatments / Results  Labs (all labs ordered are listed, but only abnormal results are displayed) Labs Reviewed - No data to display  EKG   Radiology No results found.  Procedures Procedures (including critical care time)  Medications Ordered in UC Medications - No data to display  Initial Impression / Assessment and Plan / UC Course  I have reviewed the triage vital signs and the nursing notes.  Pertinent labs & imaging results that were available during my care of the patient were reviewed by me and considered in my medical decision making (see chart for details).   Patient is well-appearing, normotensive, afebrile, not tachycardic, not tachypneic, oxygenating well on room air.    1. Acute otitis externa of both ears, unspecified type Treat with ofloxacin daily for 10 days  Ear precautions discussed Recommended follow up with ENT if symptoms do not improve with treatment   The patient was given the opportunity to ask questions.  All questions answered to their satisfaction.  The patient is in agreement to this plan.    Final Clinical Impressions(s) / UC Diagnoses   Final diagnoses:  Acute otitis externa of both ears, unspecified type     Discharge Instructions      Start using the ofloxacin drops daily in both ears to help with any infection in your outer ear.  Follow up with ENT if your symptoms do not improve with this treatment.      ED Prescriptions     Medication  Sig Dispense Auth. Provider   ofloxacin (FLOXIN) 0.3 % OTIC solution Place 10  drops into both ears daily for 7 days. 5 mL Valentino Nose, NP      PDMP not reviewed this encounter.   Valentino Nose, NP 08/30/23 [phone]

## 2023-08-31 DIAGNOSIS — M79672 Pain in left foot: Secondary | ICD-10-CM | POA: Diagnosis not present

## 2023-09-10 DIAGNOSIS — S93602A Unspecified sprain of left foot, initial encounter: Secondary | ICD-10-CM | POA: Diagnosis not present

## 2023-09-14 ENCOUNTER — Encounter (HOSPITAL_BASED_OUTPATIENT_CLINIC_OR_DEPARTMENT_OTHER): Payer: Self-pay | Admitting: Orthopaedic Surgery

## 2023-09-14 NOTE — Progress Notes (Signed)
LVM with Megan at Dr. Emelda Brothers office to relay information patient states she is not suppose to have surgery until her MRI in November and did not know she was put on the schedule for Monday 10/28.

## 2023-09-15 DIAGNOSIS — M79672 Pain in left foot: Secondary | ICD-10-CM | POA: Diagnosis not present

## 2023-09-16 ENCOUNTER — Encounter (HOSPITAL_BASED_OUTPATIENT_CLINIC_OR_DEPARTMENT_OTHER): Payer: Self-pay | Admitting: Anesthesiology

## 2023-09-16 NOTE — Anesthesia Preprocedure Evaluation (Signed)
Anesthesia Evaluation    Airway        Dental   Pulmonary           Cardiovascular      Neuro/Psych    GI/Hepatic   Endo/Other    Renal/GU      Musculoskeletal   Abdominal   Peds  Hematology   Anesthesia Other Findings   Reproductive/Obstetrics                             Anesthesia Physical Anesthesia Plan  ASA: 2  Anesthesia Plan: General and Regional   Post-op Pain Management:    Induction: Intravenous  PONV Risk Score and Plan: 3 and Ondansetron, Dexamethasone and Midazolam  Airway Management Planned: Oral ETT  Additional Equipment: None  Intra-op Plan:   Post-operative Plan: Extubation in OR  Informed Consent:   Plan Discussed with:   Anesthesia Plan Comments:        Anesthesia Quick Evaluation

## 2023-09-16 NOTE — Discharge Instructions (Signed)
Kathy Ramanathan, MD [facility]  Please read the following information regarding your care after surgery.  Medications  You only need a prescription for the narcotic pain medicine (ex. oxycodone, Percocet, Norco).  All of the other medicines listed below are available over the counter. ? Aleve 2 pills twice a day for the first 3 days after surgery. ? acetominophen (Tylenol) 650 mg every 4-6 hours as you need for minor to moderate pain ? oxycodone as prescribed for severe pain  ? To help prevent blood clots, take aspirin (81 mg) twice daily for 42 days after surgery (or total duration of nonweightbearing).  You should also get up every hour while you are awake to move around.  Weight Bearing ? Do NOT bear any weight on the operated leg or foot. This means do NOT touch your surgical leg to the ground!  Cast / Splint / Dressing ? If you have a splint, do NOT remove this. Keep your splint, cast or dressing clean and dry.  Don't put anything (coat hanger, pencil, etc) down inside of it.  If it gets wet, call the office immediately to schedule an appointment for a cast change.  Swelling IMPORTANT: It is normal for you to have swelling where you had surgery. To reduce swelling and pain, keep at least 3 pillows under your leg so that your toes are above your nose and your heel is above the level of your hip.  It may be necessary to keep your foot or leg elevated for several weeks.  This is critical to helping your incisions heal and your pain to feel better.  Follow Up Call my office at [phone] when you are discharged from the hospital or surgery center to schedule an appointment to be seen 7-10 days after surgery.  Call my office at [phone] if you develop a fever >101.5 F, nausea, vomiting, bleeding from the surgical site or severe pain.     

## 2023-09-16 NOTE — H&P (Signed)
ORTHOPAEDIC SURGERY H&P  Subjective:  The patient presents with left midfoot injury.   Past Medical History:  Diagnosis Date   ADHD    Headache    Scoliosis     Past Surgical History:  Procedure Laterality Date   MOUTH SURGERY  2017     (Not in an outpatient encounter)    Allergies  Allergen Reactions   Adderall Xr [Amphetamine-Dextroamphet Er] Other (See Comments)    Causes tachycardia   Penicillins Hives    Social History   Socioeconomic History   Marital status: Single    Spouse name: Not on file   Number of children: 0   Years of education: 12   Highest education level: Not on file  Occupational History   Occupation: Unemployed  Tobacco Use   Smoking status: Never   Smokeless tobacco: Never  Vaping Use   Vaping status: Never Used  Substance and Sexual Activity   Alcohol use: Yes    Comment: occ   Drug use: No   Sexual activity: Not Currently    Birth control/protection: Pill  Other Topics Concern   Not on file  Social History Narrative   Lives with  Mother, dad, and sister   Caffeine use: none   Social Determinants of Health   Financial Resource Strain: Not on file  Food Insecurity: Not on file  Transportation Needs: Not on file  Physical Activity: Not on file  Stress: Not on file  Social Connections: Unknown (04/04/2022)   Received from [facility], [facility]   Social Network    Social Network: Not on file  Intimate Partner Violence: Unknown (02/24/2022)   Received from [facility], [facility]   HITS    Physically Hurt: Not on file    Insult or Talk Down To: Not on file    Threaten Physical Harm: Not on file    Scream or Curse: Not on file     History reviewed. No pertinent family history.   Review of Systems Pertinent items are noted in HPI.  Objective: Vital signs in last 24 hours:    09/14/2023   12:02 PM 08/30/2023   10:10 AM 07/11/2023   10:24 AM  Vitals with BMI  Height 5' 4.5"    Weight 195 lbs    BMI 32.97     Systolic  101 97  Diastolic  66 62  Pulse  90 73      EXAM: General: Well nourished, well developed. Awake, alert and oriented to time, place, person. Normal mood and affect. No apparent distress. Breathing room air.  Operative Lower Extremity: Alignment - Neutral Deformity - None Skin intact Tenderness to palpation - left midfoot 5/5 TA, PT, GS, Per, EHL, FHL Sensation intact to light touch throughout Palpable DP and PT pulses Special testing: None  The contralateral foot/ankle was examined for comparison and noted to be neurovascularly intact with no localized deformity, swelling, or tenderness.  Imaging Review All images taken were independently reviewed by me.  Assessment/Plan: The clinical and radiographic findings were reviewed and discussed at length with the patient.  The patient has left midfoot injury - Lisfranc.  We spoke at length about the natural course of these findings. We discussed nonoperative and operative treatment options in detail.  The risks and benefits were presented and reviewed. The risks due to eventual elective hardware removal, hardware failure/irritation, new/persistent/recurrent infection, stiffness, nerve/vessel/tendon injury, nonunion/malunion of any fracture, wound healing issues, allograft usage, development of arthritis, failure of this surgery, possibility  of external fixation in certain situations, possibility of delayed definitive surgery, need for further surgery, prolonged wound care including further soft tissue coverage procedures, thromboembolic events, anesthesia/medical complications/events perioperatively and beyond, amputation, death among others were discussed. The patient acknowledged the explanation and agreed to proceed with the plan.  Kathy Kane  Orthopaedic Surgery [facility]

## 2023-09-17 ENCOUNTER — Ambulatory Visit (HOSPITAL_BASED_OUTPATIENT_CLINIC_OR_DEPARTMENT_OTHER): Admission: RE | Admit: 2023-09-17 | Payer: Medicaid Other | Source: Home / Self Care | Admitting: Orthopaedic Surgery

## 2023-09-17 DIAGNOSIS — Z01818 Encounter for other preprocedural examination: Secondary | ICD-10-CM

## 2023-09-17 SURGERY — OPEN REDUCTION INTERNAL FIXATION (ORIF) FOOT LISFRANC FRACTURE
Anesthesia: General | Site: Foot | Laterality: Left

## 2023-09-21 DIAGNOSIS — Z419 Encounter for procedure for purposes other than remedying health state, unspecified: Secondary | ICD-10-CM | POA: Diagnosis not present

## 2023-10-21 DIAGNOSIS — Z419 Encounter for procedure for purposes other than remedying health state, unspecified: Secondary | ICD-10-CM | POA: Diagnosis not present

## 2023-11-21 DIAGNOSIS — Z419 Encounter for procedure for purposes other than remedying health state, unspecified: Secondary | ICD-10-CM | POA: Diagnosis not present

## 2023-12-22 DIAGNOSIS — Z419 Encounter for procedure for purposes other than remedying health state, unspecified: Secondary | ICD-10-CM | POA: Diagnosis not present

## 2024-01-02 ENCOUNTER — Ambulatory Visit
Admission: EM | Admit: 2024-01-02 | Discharge: 2024-01-02 | Disposition: A | Discharge status: Home / Self Care | Payer: Medicaid Other | Attending: Family Medicine | Admitting: Family Medicine

## 2024-01-02 DIAGNOSIS — J069 Acute upper respiratory infection, unspecified: Secondary | ICD-10-CM

## 2024-01-02 DIAGNOSIS — J3089 Other allergic rhinitis: Secondary | ICD-10-CM | POA: Diagnosis not present

## 2024-01-02 LAB — POC COVID19/FLU A&B COMBO
Covid Antigen, POC: NEGATIVE
Influenza A Antigen, POC: NEGATIVE
Influenza B Antigen, POC: NEGATIVE

## 2024-01-02 MED ORDER — CETIRIZINE HCL 10 MG PO TABS: 10.0000 mg | ORAL_TABLET | Freq: Every day | ORAL | 2 refills | Status: DC

## 2024-01-02 MED ORDER — FLUTICASONE PROPIONATE 50 MCG/ACT NA SUSP: 1.0000 | Freq: Two times a day (BID) | NASAL | 2 refills | Status: DC

## 2024-01-02 NOTE — ED Provider Notes (Signed)
[facility]    CSN: [id number] Arrival date & time: 01/02/24  0813      History   Chief Complaint No chief complaint on file.   HPI Kathy Kane is a 28 y.o. female.   Patient presenting today with 2-day history of cough, congestion.  Denies fever, chills, chest pain, shortness of breath, abdominal pain, vomiting, diarrhea.  So for now try anything over-the-counter for symptoms.  History of seasonal allergies not currently on anything for this.  Son sick with fevers the last few days.    Past Medical History:  Diagnosis Date   ADHD    Headache    Scoliosis     Patient Active Problem List   Diagnosis Date Noted   Preterm labor 06/18/2022   Preterm contractions 06/15/2022   Chest pain, unspecified 06/18/2017   Low back pain 06/18/2017   Attention deficit hyperactivity disorder (ADHD) 10/17/2016    Past Surgical History:  Procedure Laterality Date   MOUTH SURGERY  2017    OB History     Gravida  1   Para  1   Term      Preterm  1   AB      Living  1      SAB      IAB      Ectopic      Multiple  0   Live Births  1            Home Medications    Prior to Admission medications   Medication Sig Start Date End Date Taking? Authorizing Provider  cetirizine (ZYRTEC ALLERGY) 10 MG tablet Take 1 tablet (10 mg total) by mouth daily. 01/02/24  Yes Particia Nearing, PA-C  fluticasone [facility]) 50 MCG/ACT nasal spray Place 1 spray into both nostrils 2 (two) times daily. 01/02/24  Yes Particia Nearing, PA-C  levonorgestrel-ethinyl estradiol (SEASONALE) 0.15-0.03 MG tablet Take 1 tablet by mouth daily.    [provider]    Family History Family History  Problem Relation Age of Onset   Migraines Mother    Multiple sclerosis Mother     Social History Social History   Tobacco Use   Smoking status: Never   Smokeless tobacco: Never  Vaping Use   Vaping status: Never Used  Substance Use Topics   Alcohol  use: Yes    Comment: occ   Drug use: No     Allergies   Adderall xr [amphetamine-dextroamphet er] and Penicillins   Review of Systems Review of Systems Per HPI  Physical Exam Triage Vital Signs ED Triage Vitals [01/02/24 0839]  Encounter Vitals Group     BP 122/84     Systolic BP Percentile      Diastolic BP Percentile      Pulse Rate 75     Resp 18     Temp 98.3 F (36.8 C)     Temp Source Oral     SpO2 98 %     Weight      Height      Head Circumference      Peak Flow      Pain Score      Pain Loc      Pain Education      Exclude from Growth Chart    No data found.  Updated Vital Signs BP 122/84 (BP Location: Left Arm)   Pulse 75   Temp 98.3 F (36.8 C) (Oral)   Resp 18  LMP 12/23/2023 (Approximate)   SpO2 98%   Visual Acuity Right Eye Distance:   Left Eye Distance:   Bilateral Distance:    Right Eye Near:   Left Eye Near:    Bilateral Near:     Physical Exam Vitals and nursing note reviewed.  Constitutional:      Appearance: Normal appearance.  HENT:     Head: Atraumatic.     Right Ear: Tympanic membrane and external ear normal.     Left Ear: Tympanic membrane and external ear normal.     Nose: Rhinorrhea present.     Mouth/Throat:     Mouth: Mucous membranes are moist.     Pharynx: No posterior oropharyngeal erythema.  Eyes:     Extraocular Movements: Extraocular movements intact.     Conjunctiva/sclera: Conjunctivae normal.  Cardiovascular:     Rate and Rhythm: Normal rate and regular rhythm.     Heart sounds: Normal heart sounds.  Pulmonary:     Effort: Pulmonary effort is normal.     Breath sounds: Normal breath sounds. No wheezing or rales.  Musculoskeletal:        General: Normal range of motion.     Cervical back: Normal range of motion and neck supple.  Skin:    General: Skin is warm and dry.  Neurological:     Mental Status: She is alert and oriented to person, place, and time.  Psychiatric:        Mood and Affect: Mood  normal.        Thought Content: Thought content normal.      UC Treatments / Results  Labs (all labs ordered are listed, but only abnormal results are displayed) Labs Reviewed  POC COVID19/FLU A&B COMBO    EKG   Radiology No results found.  Procedures Procedures (including critical care time)  Medications Ordered in UC Medications - No data to display  Initial Impression / Assessment and Plan / UC Course  I have reviewed the triage vital signs and the nursing notes.  Pertinent labs & imaging results that were available during my care of the patient were reviewed by me and considered in my medical decision making (see chart for details).     Vitals and exam very reassuring, rapid flu and COVID-negative.  Suspect viral versus seasonal allergy etiology.  Treat with Flonase, Zyrtec, supportive over-the-counter medications and home care.  Work note given.  Return for worsening symptoms. Final Clinical Impressions(s) / UC Diagnoses   Final diagnoses:  Seasonal allergic rhinitis due to other allergic trigger  Viral URI   Discharge Instructions   None    ED Prescriptions     Medication Sig Dispense Auth. Provider   fluticasone (FLONASE) 50 MCG/ACT nasal spray Place 1 spray into both nostrils 2 (two) times daily. 16 g Particia Nearing, [place]   cetirizine (ZYRTEC ALLERGY) 10 MG tablet Take 1 tablet (10 mg total) by mouth daily. 30 tablet Particia Nearing, [place]      PDMP not reviewed this encounter.   Particia Nearing, [place] 01/02/24 0930

## 2024-01-02 NOTE — ED Triage Notes (Signed)
Patient reports nasal congestion and cough x 2 days. Denies any fever.

## 2024-01-19 DIAGNOSIS — Z419 Encounter for procedure for purposes other than remedying health state, unspecified: Secondary | ICD-10-CM | POA: Diagnosis not present

## 2024-02-12 DIAGNOSIS — Z0000 Encounter for general adult medical examination without abnormal findings: Secondary | ICD-10-CM | POA: Diagnosis not present

## 2024-02-12 DIAGNOSIS — Z309 Encounter for contraceptive management, unspecified: Secondary | ICD-10-CM | POA: Diagnosis not present

## 2024-02-12 DIAGNOSIS — Z113 Encounter for screening for infections with a predominantly sexual mode of transmission: Secondary | ICD-10-CM | POA: Diagnosis not present

## 2024-02-12 DIAGNOSIS — R87610 Atypical squamous cells of undetermined significance on cytologic smear of cervix (ASC-US): Secondary | ICD-10-CM | POA: Diagnosis not present

## 2024-02-12 DIAGNOSIS — Z124 Encounter for screening for malignant neoplasm of cervix: Secondary | ICD-10-CM | POA: Diagnosis not present

## 2024-03-01 DIAGNOSIS — Z419 Encounter for procedure for purposes other than remedying health state, unspecified: Secondary | ICD-10-CM | POA: Diagnosis not present

## 2024-03-31 DIAGNOSIS — Z419 Encounter for procedure for purposes other than remedying health state, unspecified: Secondary | ICD-10-CM | POA: Diagnosis not present

## 2024-05-01 DIAGNOSIS — Z419 Encounter for procedure for purposes other than remedying health state, unspecified: Secondary | ICD-10-CM | POA: Diagnosis not present

## 2024-05-31 DIAGNOSIS — Z419 Encounter for procedure for purposes other than remedying health state, unspecified: Secondary | ICD-10-CM | POA: Diagnosis not present

## 2024-06-04 DIAGNOSIS — H5213 Myopia, bilateral: Secondary | ICD-10-CM | POA: Diagnosis not present

## 2024-07-01 DIAGNOSIS — Z419 Encounter for procedure for purposes other than remedying health state, unspecified: Secondary | ICD-10-CM | POA: Diagnosis not present

## 2024-08-01 DIAGNOSIS — Z419 Encounter for procedure for purposes other than remedying health state, unspecified: Secondary | ICD-10-CM | POA: Diagnosis not present

## 2024-08-31 DIAGNOSIS — Z419 Encounter for procedure for purposes other than remedying health state, unspecified: Secondary | ICD-10-CM | POA: Diagnosis not present

## 2024-10-07 DIAGNOSIS — F902 Attention-deficit hyperactivity disorder, combined type: Secondary | ICD-10-CM | POA: Diagnosis not present

## 2024-11-05 DIAGNOSIS — F902 Attention-deficit hyperactivity disorder, combined type: Secondary | ICD-10-CM | POA: Diagnosis not present

## 2024-12-25 ENCOUNTER — Ambulatory Visit: Admitting: Family Medicine

## 2024-12-25 ENCOUNTER — Encounter: Payer: Self-pay | Admitting: Family Medicine

## 2024-12-25 VITALS — BP 115/78 | HR 106 | Temp 98.2°F | Ht 64.5 in | Wt 188.2 lb

## 2024-12-25 DIAGNOSIS — Z131 Encounter for screening for diabetes mellitus: Secondary | ICD-10-CM

## 2024-12-25 DIAGNOSIS — Z0000 Encounter for general adult medical examination without abnormal findings: Secondary | ICD-10-CM | POA: Insufficient documentation

## 2024-12-25 DIAGNOSIS — F909 Attention-deficit hyperactivity disorder, unspecified type: Secondary | ICD-10-CM

## 2024-12-25 DIAGNOSIS — Z13220 Encounter for screening for lipoid disorders: Secondary | ICD-10-CM

## 2024-12-25 DIAGNOSIS — Z6831 Body mass index (BMI) 31.0-31.9, adult: Secondary | ICD-10-CM

## 2024-12-25 DIAGNOSIS — Z1159 Encounter for screening for other viral diseases: Secondary | ICD-10-CM

## 2024-12-25 DIAGNOSIS — Z1329 Encounter for screening for other suspected endocrine disorder: Secondary | ICD-10-CM

## 2024-12-25 NOTE — Progress Notes (Signed)
 "  New Patient Office Visit  Patient ID: Kathy Kane, Female   DOB: [birth year] 29 y.o. MRN: [id number]  Chief Complaint  Patient presents with   Establish Care    Forms filled out for school    Subjective:     Kathy Kane presents to establish care. Oriented to practice routines and expectations. No recent PCP. No concerns. PMH includes scoliosis, ADHD, headaches.  HPI  Discussed the use of AI scribe software for clinical note transcription with the patient, who gave verbal consent to proceed.  History of Present Illness Kathy Kane is a 29 year old female who presents to establish care and for a physical exam required for school.  She is seeking to establish care as she has not seen a primary care provider in a long time.  She has a history of ADHD and is currently being treated with methylphenidate by Luke Louder at [facility]. She experiences headaches, described as 'bad headaches', but not as severe as migraines she had as a child. She manages these with Tylenol  as needed.  Her past medical history includes a pregnancy during which she required an iron infusion and delivered at 36 weeks and 4 days. No history of gestational diabetes or hypertension during pregnancy.  She is currently on birth control, specifically norethindrone, prescribed by [facility], where she also receives routine women's care including Pap smears. She is up to date with her Pap smears, with the next one scheduled for March or April.  Her family history is significant for cervical cancer in her mother and a history of breast cancer on her mother's side, though her mother did not have breast cancer.  She reports an allergy to alcohol that developed after her pregnancy and an allergy to clear rigid tape used by phlebotomists.  She denies smoking, drug use, and alcohol use.  She has a history of astigmatism in one eye and wears glasses primarily at night and for  schoolwork. No current dental issues, but mentions a past attempt to correct gum recession that was unsuccessful.  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   Outpatient Encounter Medications as of 12/25/2024  Medication Sig   levonorgestrel-ethinyl estradiol (SEASONALE) 0.15-0.03 MG tablet Take 1 tablet by mouth daily.   methylphenidate 54 MG PO CR tablet Take 54 mg by mouth every morning.   SIMPESSE 0.15-0.03 &0.01 MG tablet Take 1 tablet by mouth daily.   [DISCONTINUED] cetirizine  (ZYRTEC  ALLERGY) 10 MG tablet Take 1 tablet (10 mg total) by mouth daily.   [DISCONTINUED] fluticasone  (FLONASE ) 50 MCG/ACT nasal spray Place 1 spray into both nostrils 2 (two) times daily.   No facility-administered encounter medications on file as of 12/25/2024.    Past Medical History:  Diagnosis Date   ADHD    Headache    Scoliosis     Past Surgical History:  Procedure Laterality Date   MOUTH SURGERY  2017    Family History  Problem Relation Age of Onset   Migraines Mother    Multiple sclerosis Mother    Aneurysm Mother    Diabetes Father    Asthma Sister    Sleep apnea Son     Social History   Socioeconomic History   Marital status: Single    Spouse name: Not on file   Number of children: 0   Years of education: 12   Highest education level: Not on file  Occupational History   Occupation: Unemployed  Tobacco Use   Smoking status: Never    Passive exposure: Never   Smokeless tobacco: Never  Vaping Use   Vaping status: Never Used  Substance and Sexual Activity   Alcohol use: Not Currently    Comment: occ   Drug use: No   Sexual activity: Not Currently    Birth control/protection: Pill  Other Topics Concern   Not on file  Social History Narrative   Lives with  Mother, dad, and sister   Caffeine use: none   Social Drivers of Health   Tobacco Use: Low Risk (12/25/2024)   Patient History    Smoking Tobacco Use:  Never    Smokeless Tobacco Use: Never    Passive Exposure: Never  Financial Resource Strain: Not on file  Food Insecurity: Not on file  Transportation Needs: Not on file  Physical Activity: Not on file  Stress: Not on file  Social Connections: Unknown (04/04/2022)   Received from [facility]   Social Network    Social Network: Not on file  Intimate Partner Violence: Unknown (02/24/2022)   Received from [facility]   HITS    Physically Hurt: Not on file    Insult or Talk Down To: Not on file    Threaten Physical Harm: Not on file    Scream or Curse: Not on file  Depression (PHQ2-9): Low Risk (12/25/2024)   Depression (PHQ2-9)    PHQ-2 Score: 0  Alcohol Screen: Not on file  Housing: Not on file  Utilities: Not on file  Health Literacy: Not on file    Review of Systems  Constitutional: Negative.   HENT: Negative.    Eyes: Negative.   Respiratory: Negative.    Cardiovascular: Negative.   Gastrointestinal: Negative.   Genitourinary: Negative.   Musculoskeletal: Negative.   Skin: Negative.   Neurological: Negative.   Endo/Heme/Allergies: Negative.   Psychiatric/Behavioral: Negative.    All other systems reviewed and are negative.     Objective:    BP 115/78   Pulse (!) 106   Temp 98.2 F (36.8 C)   Ht 5' 4.5 (1.638 m)   Wt 188 lb 3.2 oz (85.4 kg)   LMP 08/28/2024 (Exact Date) Comment: 10/09/20258-09/01/2024  SpO2 97%   BMI 31.81 kg/m   Physical Exam Vitals and nursing note reviewed.  Constitutional:      Appearance: Normal appearance. She is obese.  HENT:     Head: Normocephalic and atraumatic.     Right Ear: Tympanic membrane, ear canal and external ear normal.     Left Ear: Tympanic membrane, ear canal and external ear normal.     Nose: Nose normal.     Mouth/Throat:     Mouth: Mucous membranes are moist.     Pharynx: Oropharynx is clear.  Eyes:     Extraocular Movements: Extraocular movements intact.     Conjunctiva/sclera: Conjunctivae normal.      Pupils: Pupils are equal, round, and reactive to light.  Cardiovascular:     Rate and Rhythm: Normal rate and regular rhythm.     Pulses: Normal pulses.     Heart sounds: Normal heart sounds.  Pulmonary:     Effort: Pulmonary effort is normal.     Breath sounds: Normal breath sounds.  Abdominal:     General: Bowel sounds are normal.     Palpations: Abdomen is soft.  Musculoskeletal:        General: Normal range of motion.  Cervical back: Normal range of motion and neck supple.  Skin:    General: Skin is warm and dry.     Capillary Refill: Capillary refill takes less than 2 seconds.  Neurological:     General: No focal deficit present.     Mental Status: She is alert and oriented to person, place, and time. Mental status is at baseline.  Psychiatric:        Mood and Affect: Mood normal.        Behavior: Behavior normal.        Thought Content: Thought content normal.        Judgment: Judgment normal.          Assessment & Plan:   Problem List Items Addressed This Visit       Other   Attention deficit hyperactivity disorder (ADHD)   Physical exam, annual - Primary   Relevant Orders   CBC with Differential/Platelet   Comprehensive metabolic panel with GFR   Lipid panel   Hemoglobin A1c   TSH   Other Visit Diagnoses       Class 1 obesity with body mass index (BMI) of 31.0 to 31.9 in adult, unspecified obesity type, unspecified whether serious comorbidity present       Relevant Medications   methylphenidate 54 MG PO CR tablet   Other Relevant Orders   CBC with Differential/Platelet   Comprehensive metabolic panel with GFR   Lipid panel   Hemoglobin A1c   TSH     Screening for thyroid disorder       Relevant Orders   TSH     Screening for diabetes mellitus       Relevant Orders   Hemoglobin A1c     Screening for lipoid disorders       Relevant Orders   Lipid panel     Need for hepatitis C screening test       Relevant Orders   Hepatitis C antibody        Assessment and Plan Assessment & Plan Attention-deficit hyperactivity disorder ADHD managed with methylphenidate prescribed by Luke Louder at [facility].  Obesity - Counseled on importance of weight management for overall health. Encouraged low calorie, heart healthy diet and moderate intensity exercise 150 minutes weekly. This is 3-5 times weekly for 30-50 minutes each session. Goal should be pace of 3 miles/hours, or walking 1.5 miles in 30 minutes and include strength training.  General Health Maintenance Routine health maintenance discussed. Pap smear and tetanus vaccination are current. Hepatitis C screening recommended. No flu shot or STD testing needed. Family history of breast cancer noted. - Ordered blood work including CBC, metabolic panel, thyroid function tests, diabetes screening, cholesterol, and hepatitis C screening. - Ensure Pap smear is up to date with appointment in March or April. - Advised to obtain shot records from the registry for school. - Recommended biannual dental visits.    Return in about 1 year (around 12/25/2025) for annual physical with labs 1 week prior.   Jeoffrey GORMAN Barrio, FNP [facility]     "

## 2024-12-25 NOTE — Patient Instructions (Signed)
 It was great to meet you today and I'm excited to have you join the [facility] practice. I hope you had a positive experience today! If you feel so inclined, please feel free to recommend our practice to friends and family. Jeoffrey Barrio, FNP-C

## 2024-12-26 LAB — CBC WITH DIFFERENTIAL/PLATELET
Absolute Lymphocytes: 1961 {cells}/uL (ref 850–3900)
Absolute Monocytes: 464 {cells}/uL (ref 200–950)
Basophils Absolute: 38 {cells}/uL (ref 0–200)
Basophils Relative: 0.5 %
Eosinophils Absolute: 122 {cells}/uL (ref 15–500)
Eosinophils Relative: 1.6 %
HCT: 40.4 % (ref 35.9–46.0)
Hemoglobin: 13.3 g/dL (ref 11.7–15.5)
MCH: 27.3 pg (ref 27.0–33.0)
MCHC: 32.9 g/dL (ref 31.6–35.4)
MCV: 83 fL (ref 81.4–101.7)
MPV: 10 fL (ref 7.5–12.5)
Monocytes Relative: 6.1 %
Neutro Abs: 5016 {cells}/uL (ref 1500–7800)
Neutrophils Relative %: 66 %
Platelets: 306 10*3/uL (ref 140–400)
RBC: 4.87 Million/uL (ref 3.80–5.10)
RDW: 13.2 % (ref 11.0–15.0)
Total Lymphocyte: 25.8 %
WBC: 7.6 10*3/uL (ref 3.8–10.8)

## 2024-12-26 LAB — COMPREHENSIVE METABOLIC PANEL WITH GFR
AG Ratio: 1.8 (calc) (ref 1.0–2.5)
ALT: 45 U/L — ABNORMAL HIGH (ref 6–29)
AST: 26 U/L (ref 10–30)
Albumin: 4.6 g/dL (ref 3.6–5.1)
Alkaline phosphatase (APISO): 57 U/L (ref 31–125)
BUN: 15 mg/dL (ref 7–25)
CO2: 25 mmol/L (ref 20–32)
Calcium: 9.3 mg/dL (ref 8.6–10.2)
Chloride: 104 mmol/L (ref 98–110)
Creat: 0.63 mg/dL (ref 0.50–0.96)
Globulin: 2.5 g/dL (ref 1.9–3.7)
Glucose, Bld: 77 mg/dL (ref 65–99)
Potassium: 4.4 mmol/L (ref 3.5–5.3)
Sodium: 138 mmol/L (ref 135–146)
Total Bilirubin: 0.3 mg/dL (ref 0.2–1.2)
Total Protein: 7.1 g/dL (ref 6.1–8.1)
eGFR: 125 mL/min/{1.73_m2}

## 2024-12-26 LAB — LIPID PANEL
Cholesterol: 189 mg/dL
HDL: 46 mg/dL — ABNORMAL LOW
LDL Cholesterol (Calc): 123 mg/dL — ABNORMAL HIGH
Non-HDL Cholesterol (Calc): 143 mg/dL — ABNORMAL HIGH
Total CHOL/HDL Ratio: 4.1 (calc)
Triglycerides: 95 mg/dL

## 2024-12-26 LAB — HEMOGLOBIN A1C
Hgb A1c MFr Bld: 5.3 %
Mean Plasma Glucose: 105 mg/dL
eAG (mmol/L): 5.8 mmol/L

## 2024-12-26 LAB — HEPATITIS C ANTIBODY: Hepatitis C Ab: NONREACTIVE

## 2024-12-26 LAB — TSH: TSH: 1.43 m[IU]/L
